# Patient Record
Sex: Male | Born: 1965
Health system: Southern US, Community
[De-identification: ages and names within clinical notes are randomized; demographics above are authoritative.]

## PROBLEM LIST (undated history)

## (undated) DIAGNOSIS — E119 Type 2 diabetes mellitus without complications: Secondary | ICD-10-CM

## (undated) DIAGNOSIS — E7800 Pure hypercholesterolemia, unspecified: Secondary | ICD-10-CM

## (undated) DIAGNOSIS — Z973 Presence of spectacles and contact lenses: Secondary | ICD-10-CM

## (undated) DIAGNOSIS — I10 Essential (primary) hypertension: Secondary | ICD-10-CM

## (undated) DIAGNOSIS — R3912 Poor urinary stream: Secondary | ICD-10-CM

## (undated) DIAGNOSIS — N35919 Unspecified urethral stricture, male, unspecified site: Secondary | ICD-10-CM

## (undated) HISTORY — PX: OTHER SURGICAL HISTORY: SHX169

---

## 2013-11-02 ENCOUNTER — Ambulatory Visit (HOSPITAL_COMMUNITY)
Admission: RE | Admit: 2013-11-02 | Discharge: 2013-11-02 | Disposition: A | Discharge status: Home / Self Care | Payer: BC Managed Care – PPO | Source: Ambulatory Visit | Attending: Internal Medicine | Admitting: Internal Medicine

## 2013-11-02 ENCOUNTER — Other Ambulatory Visit (HOSPITAL_COMMUNITY): Payer: Self-pay | Admitting: Internal Medicine

## 2013-11-02 DIAGNOSIS — R51 Headache: Principal | ICD-10-CM

## 2013-11-02 DIAGNOSIS — R609 Edema, unspecified: Secondary | ICD-10-CM

## 2013-11-02 DIAGNOSIS — R519 Headache, unspecified: Secondary | ICD-10-CM

## 2016-07-25 ENCOUNTER — Observation Stay (HOSPITAL_COMMUNITY)
Admission: EM | Admit: 2016-07-25 | Discharge: 2016-07-27 | Disposition: A | Discharge status: Home / Self Care | Payer: Managed Care, Other (non HMO) | Attending: Internal Medicine | Admitting: Internal Medicine

## 2016-07-25 ENCOUNTER — Encounter (HOSPITAL_COMMUNITY): Payer: Self-pay | Admitting: *Deleted

## 2016-07-25 DIAGNOSIS — Z9114 Patient's other noncompliance with medication regimen: Secondary | ICD-10-CM | POA: Diagnosis not present

## 2016-07-25 DIAGNOSIS — E7800 Pure hypercholesterolemia, unspecified: Secondary | ICD-10-CM | POA: Diagnosis present

## 2016-07-25 DIAGNOSIS — F17200 Nicotine dependence, unspecified, uncomplicated: Secondary | ICD-10-CM | POA: Insufficient documentation

## 2016-07-25 DIAGNOSIS — E1165 Type 2 diabetes mellitus with hyperglycemia: Secondary | ICD-10-CM | POA: Insufficient documentation

## 2016-07-25 DIAGNOSIS — IMO0002 Reserved for concepts with insufficient information to code with codable children: Secondary | ICD-10-CM | POA: Diagnosis present

## 2016-07-25 DIAGNOSIS — R739 Hyperglycemia, unspecified: Secondary | ICD-10-CM

## 2016-07-25 DIAGNOSIS — R509 Fever, unspecified: Secondary | ICD-10-CM | POA: Diagnosis present

## 2016-07-25 DIAGNOSIS — E782 Mixed hyperlipidemia: Secondary | ICD-10-CM | POA: Diagnosis not present

## 2016-07-25 DIAGNOSIS — N390 Urinary tract infection, site not specified: Principal | ICD-10-CM | POA: Insufficient documentation

## 2016-07-25 DIAGNOSIS — Z720 Tobacco use: Secondary | ICD-10-CM | POA: Diagnosis present

## 2016-07-25 DIAGNOSIS — N12 Tubulo-interstitial nephritis, not specified as acute or chronic: Secondary | ICD-10-CM | POA: Diagnosis not present

## 2016-07-25 DIAGNOSIS — Z794 Long term (current) use of insulin: Secondary | ICD-10-CM

## 2016-07-25 HISTORY — DX: Pure hypercholesterolemia, unspecified: E78.00

## 2016-07-25 HISTORY — DX: Type 2 diabetes mellitus without complications: E11.9

## 2016-07-25 LAB — CBC WITH DIFFERENTIAL/PLATELET
BASOS ABS: 0 10*3/uL (ref 0.0–0.1)
BASOS PCT: 0 %
EOS ABS: 0 10*3/uL (ref 0.0–0.7)
EOS PCT: 0 %
HCT: 39.1 % (ref 39.0–52.0)
Hemoglobin: 13.4 g/dL (ref 13.0–17.0)
LYMPHS PCT: 11 %
Lymphs Abs: 1.9 10*3/uL (ref 0.7–4.0)
MCH: 29.1 pg (ref 26.0–34.0)
MCHC: 34.3 g/dL (ref 30.0–36.0)
MCV: 84.8 fL (ref 78.0–100.0)
MONO ABS: 1.4 10*3/uL — AB (ref 0.1–1.0)
Monocytes Relative: 8 %
Neutro Abs: 13.3 10*3/uL — ABNORMAL HIGH (ref 1.7–7.7)
Neutrophils Relative %: 81 %
PLATELETS: 285 10*3/uL (ref 150–400)
RBC: 4.61 MIL/uL (ref 4.22–5.81)
RDW: 13.2 % (ref 11.5–15.5)
WBC: 16.6 10*3/uL — AB (ref 4.0–10.5)

## 2016-07-25 LAB — URINALYSIS, ROUTINE W REFLEX MICROSCOPIC
BILIRUBIN URINE: NEGATIVE
Ketones, ur: 5 mg/dL — AB
NITRITE: NEGATIVE
PROTEIN: 30 mg/dL — AB
Specific Gravity, Urine: 1.024 (ref 1.005–1.030)
pH: 5 (ref 5.0–8.0)

## 2016-07-25 LAB — GLUCOSE, CAPILLARY
GLUCOSE-CAPILLARY: 394 mg/dL — AB (ref 65–99)
GLUCOSE-CAPILLARY: 359 mg/dL — AB (ref 65–99)
GLUCOSE-CAPILLARY: 107 mg/dL — AB (ref 65–99)
Glucose-Capillary: 129 mg/dL — ABNORMAL HIGH (ref 65–99)

## 2016-07-25 LAB — COMPREHENSIVE METABOLIC PANEL
ALBUMIN: 3.1 g/dL — AB (ref 3.5–5.0)
ALK PHOS: 100 U/L (ref 38–126)
ALT: 44 U/L (ref 17–63)
AST: 14 U/L — AB (ref 15–41)
Anion gap: 9 (ref 5–15)
BILIRUBIN TOTAL: 0.4 mg/dL (ref 0.3–1.2)
BUN: 13 mg/dL (ref 6–20)
CALCIUM: 9 mg/dL (ref 8.9–10.3)
CO2: 25 mmol/L (ref 22–32)
CREATININE: 0.94 mg/dL (ref 0.61–1.24)
Chloride: 97 mmol/L — ABNORMAL LOW (ref 101–111)
GFR calc Af Amer: >60 mL/min (ref >60)
GLUCOSE: 335 mg/dL — AB (ref 65–99)
POTASSIUM: 4.1 mmol/L (ref 3.5–5.1)
Sodium: 131 mmol/L — ABNORMAL LOW (ref 135–145)
TOTAL PROTEIN: 7.1 g/dL (ref 6.5–8.1)

## 2016-07-25 LAB — LACTIC ACID, PLASMA: Lactic Acid, Venous: 1 mmol/L (ref 0.5–1.9)

## 2016-07-25 LAB — CBG MONITORING, ED
GLUCOSE-CAPILLARY: 394 mg/dL — AB (ref 65–99)
GLUCOSE-CAPILLARY: 338 mg/dL — AB (ref 65–99)

## 2016-07-25 MED ORDER — SODIUM CHLORIDE 0.9 % IV BOLUS (SEPSIS)
500.0000 mL | Freq: Once | INTRAVENOUS | Status: AC
  Administered 2016-07-25: 500 mL via INTRAVENOUS

## 2016-07-25 MED ORDER — INSULIN ASPART 100 UNIT/ML ~~LOC~~ SOLN
0.0000 [IU] | Freq: Three times a day (TID) | SUBCUTANEOUS | Status: DC
  Administered 2016-07-25 (×2): 15 [IU] via SUBCUTANEOUS
  Administered 2016-07-25: 2 [IU] via SUBCUTANEOUS
  Administered 2016-07-26 (×2): 3 [IU] via SUBCUTANEOUS
  Administered 2016-07-26: 8 [IU] via SUBCUTANEOUS
  Administered 2016-07-27: 3 [IU] via SUBCUTANEOUS

## 2016-07-25 MED ORDER — DEXTROSE 5 % IV SOLN
1.0000 g | INTRAVENOUS | Status: DC
  Administered 2016-07-25 – 2016-07-27 (×3): 1 g via INTRAVENOUS
  Filled 2016-07-25 (×5): qty 10

## 2016-07-25 MED ORDER — SODIUM CHLORIDE 0.9 % IV BOLUS (SEPSIS)
1000.0000 mL | Freq: Once | INTRAVENOUS | Status: AC
  Administered 2016-07-25: 1000 mL via INTRAVENOUS

## 2016-07-25 MED ORDER — ENOXAPARIN SODIUM 40 MG/0.4ML ~~LOC~~ SOLN
40.0000 mg | SUBCUTANEOUS | Status: DC
  Administered 2016-07-25 – 2016-07-26 (×2): 40 mg via SUBCUTANEOUS
  Filled 2016-07-25 (×3): qty 0.4

## 2016-07-25 MED ORDER — DEXTROSE 5 % IV SOLN
1.0000 g | Freq: Once | INTRAVENOUS | Status: AC
  Administered 2016-07-25: 1 g via INTRAVENOUS
  Filled 2016-07-25: qty 10

## 2016-07-25 MED ORDER — SODIUM CHLORIDE 0.9 % IV SOLN
INTRAVENOUS | Status: DC
  Administered 2016-07-25 – 2016-07-27 (×4): via INTRAVENOUS

## 2016-07-25 MED ORDER — INSULIN ASPART 100 UNIT/ML ~~LOC~~ SOLN: 0.0000 [IU] | Freq: Every day | SUBCUTANEOUS | Status: DC

## 2016-07-25 NOTE — Progress Notes (Signed)
Inpatient Diabetes Program Recommendations  AACE/ADA: New Consensus Statement on Inpatient Glycemic Control (2015)  Target Ranges:  Prepandial:   less than 140 mg/dL      Peak postprandial:   less than 180 mg/dL (1-2 hours)      Critically ill patients:  140 - 180 mg/dL  Results for Meridee ScoreHALL, Manpreet (MRN [id number]) as of 07/25/2016 09:03  Ref. Range 07/25/2016 03:18 07/25/2016 05:14 07/25/2016 07:59  Glucose-Capillary Latest Ref Range: 65 - 99 mg/dL 409394 (H) 811338 (H) 914394 (H)    Review of Glycemic Control  Diabetes history: DM2 Outpatient Diabetes medications: None listed on home med list but per H&P patient was on Metformin and insulin but has not taken in over a year due to cost Current orders for Inpatient glycemic control: Novolog 0-15 units TID with meals, Novolog 0-5 units QHS  Inpatient Diabetes Program Recommendations: Insulin - Basal: Please consider ordering 70/30 10 units BID with meals. HgbA1C: A1C in process.  NOTE: Patient has orders for Novolog correction which have not been released yet by nursing. Called Val,LPN and asked that orders be released and correction given.  Thanks, Orlando PennerMarie Thurl Boen, RN, MSN, CDE Diabetes Coordinator Inpatient Diabetes Program [phone] (Team Pager from 8am to 5pm)

## 2016-07-25 NOTE — Clinical Social Work Note (Signed)
CSW received consult and discussed with RN who reports referral is for medication assistance. CM notified. Will sign off.  Derenda FennelKara Karleigh Bunte, LCSW [phone]

## 2016-07-25 NOTE — Care Management Note (Signed)
Case Management Note  Patient Details  Name: Bryce Stewart MRN: [id number] Date of Birth: 12/01/1965  Subjective/Objective:                  Pt is from home, ind with ADL's. He is employed and has insurance with drug coverage. Pt's insurance changed earlier this year and his insulin became to expensive for his to take so he stopped taking it. CM will discuss pt's needs to DC and do benefits check to determine most cost efficient regime for pt.   Action/Plan: Pt will need Rx for glucose meter and supplies also. Will cont to follow.   Expected Discharge Date:  07/26/16               Expected Discharge Plan:  Home/Self Care  In-House Referral:  NA  Discharge planning Services  CM Consult, Medication Assistance  Post Acute Care Choice:  NA Choice offered to:  NA  Status of Service:  In process, will continue to follow  [name], [name], RN 07/25/2016, 3:17 PM

## 2016-07-25 NOTE — ED Provider Notes (Signed)
[facility]-EM[facility]ENCY DEPT Provider Note   CSN: [id number] Arrival date & time: 07/25/16  0259  Time seen 03:23 AM   History   Chief Complaint Chief Complaint  Patient presents with  . Generalized Body Aches    HPI Bryce Stewart is a 50 y.o. male.  HPI  patient reports that around Thanksgiving, November 23 he had some chills and he took Mucinex DM and felt better. However he started having chills again on December 4 that he states lasts for hours at a time. His wife states when she took his temperature on Monday it was 102. He denies cough, rhinorrhea, sore throat, nausea, or vomiting. He states he had diarrhea once. He reports urinary frequency for months and states for the past month he's had dysuria. He states he has a lot of thirst and he drinks a lot of diet sodas especially sun drop. He denies any recent weight loss in the past year. He did not get a flu shot this year.  Patient used to be on metformin, Crestor, and insulin twice a day however he stopped all of it a year ago. He was having episodes of hypoglycemia and feeling bad. He states his primary care doctor is aware and tries to convince him to take his medications.  PCP Dr Felecia Shelling  Past Medical History:  Diagnosis Date  . Diabetes mellitus without complication (HCC)   . High cholesterol     There are no active problems to display for this patient.   History reviewed. No pertinent surgical history.     Home Medications    Prior to Admission medications   Not on File    Family History History reviewed. No pertinent family history.  Social History Social History  Substance Use Topics  . Smoking status: Current Every Day Smoker    Packs/day: 0.50  . Smokeless tobacco: Never Used  . Alcohol use No  lives with spouse employed   Allergies   Patient has no known allergies.   Review of Systems Review of Systems  All other systems reviewed and are negative.    Physical Exam Updated Vital Signs BP 145/84  (BP Location: Left Arm)   Pulse 96   Temp 99.9 F (37.7 C) (Oral)   Ht 5' 7.5" (1.715 m)   Wt 182 lb (82.6 kg)   SpO2 98%   BMI 28.08 kg/m   Vital signs normal except for low-grade temp   Physical Exam   ED Treatments / Results  Labs (all labs ordered are listed, but only abnormal results are displayed) Results for orders placed or performed during the hospital encounter of 07/25/16  Culture, blood (routine x 2)  Result Value Ref Range   Specimen Description BLOOD RIGHT FOREARM    Special Requests BOTTLES DRAWN AEROBIC AND ANAEROBIC 10CC EACH    Culture PENDING    Report Status PENDING   Culture, blood (routine x 2)  Result Value Ref Range   Specimen Description BLOOD LEFT ANTECUBITAL    Special Requests BOTTLES DRAWN AEROBIC AND ANAEROBIC 12CC EACH    Culture PENDING    Report Status PENDING   Comprehensive metabolic panel  Result Value Ref Range   Sodium 131 (L) 135 - 145 mmol/L   Potassium 4.1 3.5 - 5.1 mmol/L   Chloride 97 (L) 101 - 111 mmol/L   CO2 25 22 - 32 mmol/L   Glucose, Bld 335 (H) 65 - 99 mg/dL   BUN 13 6 - 20 mg/dL   Creatinine, Ser 4.09  0.61 - 1.24 mg/dL   Calcium 9.0 8.9 - 16.110.3 mg/dL   Total Protein 7.1 6.5 - 8.1 g/dL   Albumin 3.1 (L) 3.5 - 5.0 g/dL   AST 14 (L) 15 - 41 U/L   ALT 44 17 - 63 U/L   Alkaline Phosphatase 100 38 - 126 U/L   Total Bilirubin 0.4 0.3 - 1.2 mg/dL   GFR calc non Af Amer >60 >60 mL/min   GFR calc Af Amer >60 >60 mL/min   Anion gap 9 5 - 15  CBC with Differential  Result Value Ref Range   WBC 16.6 (H) 4.0 - 10.5 K/uL   RBC 4.61 4.22 - 5.81 MIL/uL   Hemoglobin 13.4 13.0 - 17.0 g/dL   HCT 09.639.1 04.539.0 - 40.952.0 %   MCV 84.8 78.0 - 100.0 fL   MCH 29.1 26.0 - 34.0 pg   MCHC 34.3 30.0 - 36.0 g/dL   RDW 81.113.2 91.411.5 - 78.215.5 %   Platelets 285 150 - 400 K/uL   Neutrophils Relative % 81 %   Neutro Abs 13.3 (H) 1.7 - 7.7 K/uL   Lymphocytes Relative 11 %   Lymphs Abs 1.9 0.7 - 4.0 K/uL   Monocytes Relative 8 %   Monocytes Absolute  1.4 (H) 0.1 - 1.0 K/uL   Eosinophils Relative 0 %   Eosinophils Absolute 0.0 0.0 - 0.7 K/uL   Basophils Relative 0 %   Basophils Absolute 0.0 0.0 - 0.1 K/uL  Urinalysis, Routine w reflex microscopic  Result Value Ref Range   Color, Urine YELLOW YELLOW   APPearance HAZY (A) CLEAR   Specific Gravity, Urine 1.024 1.005 - 1.030   pH 5.0 5.0 - 8.0   Glucose, UA >=500 (A) NEGATIVE mg/dL   Hgb urine dipstick SMALL (A) NEGATIVE   Bilirubin Urine NEGATIVE NEGATIVE   Ketones, ur 5 (A) NEGATIVE mg/dL   Protein, ur 30 (A) NEGATIVE mg/dL   Nitrite NEGATIVE NEGATIVE   Leukocytes, UA MODERATE (A) NEGATIVE   RBC / HPF 0-5 0 - 5 RBC/hpf   WBC, UA TOO NUMEROUS TO COUNT 0 - 5 WBC/hpf   Bacteria, UA RARE (A) NONE SEEN  Lactic acid, plasma  Result Value Ref Range   Lactic Acid, Venous 1.0 0.5 - 1.9 mmol/L  POC CBG, ED  Result Value Ref Range   Glucose-Capillary 394 (H) 65 - 99 mg/dL  CBG monitoring, ED  Result Value Ref Range   Glucose-Capillary 338 (H) 65 - 99 mg/dL   Laboratory interpretation all normal except hyperglycemia, leukocytosis, UTI    EKG  EKG Interpretation None       Radiology No results found.  Procedures Procedures (including critical care time)  Medications Ordered in ED Medications  sodium chloride 0.9 % bolus 1,000 mL (0 mLs Intravenous Stopped 07/25/16 0518)  sodium chloride 0.9 % bolus 500 mL (0 mLs Intravenous Stopped 07/25/16 0600)  cefTRIAXone (ROCEPHIN) 1 g in dextrose 5 % 50 mL IVPB (0 g Intravenous Stopped 07/25/16 0600)     Initial Impression / Assessment and Plan / ED Course  I have reviewed the triage vital signs and the nursing notes.  Pertinent labs & imaging results that were available during my care of the patient were reviewed by me and considered in my medical decision making (see chart for details).  Clinical Course    Patient had IV inserted. Blood work was ordered, patient was requested to get a urinalysis sample. I'm hoping he has a  urinary tract infection  as the source of his fever. We had a long discussion about not treating his diabetes and the potential long term problems it could turn into.  After reviewing patient's urinalysis he was started on IV Rocephin.  5 AM, I discussed patient's test results with patient and his wife. Wife states his temperature at home tonight was 101. Patient states he still doesn't feel well after the IV fluids. I am going to talk to the hospitalist about admission for IV antibiotics and control of his diabetes. Repeat temperature is still low grade. His CBG is still in the low 300's after the liter of NS.   05:19 AM Dr Conley RollsLe, hospitalist, will admit  Final Clinical Impressions(s) / ED Diagnoses   Final diagnoses:  Fever, unspecified fever cause  Urinary tract infection without hematuria, site unspecified  Hyperglycemia  Noncompliance with medications    Plan admission  Devoria AlbeIva Yarelis Ambrosino, MD, Concha PyoFACEP     Tapanga Ottaway, MD 07/25/16 [phone]

## 2016-07-25 NOTE — H&P (Signed)
History and Physical    Bryce ScoreMark Levene ZOX:[id number] DOB: 07/05/1966 DOA: 07/25/2016  PCP: Avon GullyFANTA,TESFAYE, MD  Patient coming from: Home.    Chief Complaint:  Chills.   HPI: Bryce Stewart is an 50 y.o. male with hx of DM2 on metformin and insulin, hx of HLD, BPH, hadn't taken any of his meds for a year due to cost, tobacco abuse, presented to the ER with low back pain and chills.  Evaluation in the ER included a UA with TNTC WBCs, and leukocytosis with WBC of 16K, BS of 300s and no acidosis.  He denied SOB, nausea, vomiting, abdominal pain, or hematuria.  Hospitalist was asked to admit him for pyelonephritis and DM out of control.    ED Course:  See above.  Rewiew of Systems:  Constitutional: Negative for malaise, fever and chills. No significant weight loss or weight gain Eyes: Negative for eye pain, redness and discharge, diplopia, visual changes, or flashes of light. ENMT: Negative for ear pain, hoarseness, nasal congestion, sinus pressure and sore throat. No headaches; tinnitus, drooling, or problem swallowing. Cardiovascular: Negative for chest pain, palpitations, diaphoresis, dyspnea and peripheral edema. ; No orthopnea, PND Respiratory: Negative for cough, hemoptysis, wheezing and stridor. No pleuritic chestpain. Gastrointestinal: Negative for diarrhea, constipation,  melena, blood in stool, hematemesis, jaundice and rectal bleeding.    Genitourinary: Negative for frequency, dysuria, incontinence,flank pain and hematuria; Musculoskeletal: Negative for back pain and neck pain. Negative for swelling and trauma.;  Skin: . Negative for pruritus, rash, abrasions, bruising and skin lesion.; ulcerations Neuro: Negative for headache, lightheadedness and neck stiffness. Negative for weakness, altered level of consciousness , altered mental status, extremity weakness, burning feet, involuntary movement, seizure and syncope.  Psych: negative for anxiety, depression, insomnia, tearfulness, panic attacks,  hallucinations, paranoia, suicidal or homicidal ideation    Past Medical History:  Diagnosis Date  . Diabetes mellitus without complication (HCC)   . High cholesterol     History reviewed. No pertinent surgical history.   reports that he has been smoking.  He has been smoking about 0.50 packs per day. He has never used smokeless tobacco. He reports that he does not drink alcohol or use drugs.  Allergies no known allergies  History reviewed. No pertinent family history.   Prior to Admission medications   Not on File    Physical Exam: Vitals:   07/25/16 0309 07/25/16 0313 07/25/16 0515  BP:  145/84 130/84  Pulse:  96   Resp:   18  Temp:  99.9 F (37.7 C) 99.1 F (37.3 C)  TempSrc:  Oral Oral  SpO2:  98% 96%  Weight: 82.6 kg (182 lb)    Height: 5' 7.5" (1.715 m)     Constitutional: NAD, calm, comfortable Vitals:   07/25/16 0309 07/25/16 0313 07/25/16 0515  BP:  145/84 130/84  Pulse:  96   Resp:   18  Temp:  99.9 F (37.7 C) 99.1 F (37.3 C)  TempSrc:  Oral Oral  SpO2:  98% 96%  Weight: 82.6 kg (182 lb)    Height: 5' 7.5" (1.715 m)     Eyes: PERRL, lids and conjunctivae normal ENMT: Mucous membranes are moist. Posterior pharynx clear of any exudate or lesions.Normal dentition.  Neck: normal, supple, no masses, no thyromegaly Respiratory: clear to auscultation bilaterally, no wheezing, no crackles. Normal respiratory effort. No accessory muscle use.  Cardiovascular: Regular rate and rhythm, no murmurs / rubs / gallops. No extremity edema. 2+ pedal pulses. No carotid bruits.  Abdomen: no  tenderness, no masses palpated. No hepatosplenomegaly. Bowel sounds positive.  Musculoskeletal: no clubbing / cyanosis. No joint deformity upper and lower extremities. Good ROM, no contractures. Normal muscle tone.  Skin: no rashes, lesions, ulcers. No induration Neurologic: CN 2-12 grossly intact. Sensation intact, DTR normal. Strength 5/5 in all 4.  Psychiatric: Normal judgment  and insight. Alert and oriented x 3. Normal mood.   Labs on Admission: I have personally reviewed following labs and imaging studies  CBC:  Recent Labs Lab 07/25/16 0349  WBC 16.6*  NEUTROABS 13.3*  HGB 13.4  HCT 39.1  MCV 84.8  PLT 285   Basic Metabolic Panel:  Recent Labs Lab 07/25/16 0349  NA 131*  K 4.1  CL 97*  CO2 25  GLUCOSE 335*  BUN 13  CREATININE 0.94  CALCIUM 9.0   GFR: Estimated Creatinine Clearance: 99.8 mL/min (by C-G formula based on SCr of 0.94 mg/dL). Liver Function Tests:  Recent Labs Lab 07/25/16 0349  AST 14*  ALT 44  ALKPHOS 100  BILITOT 0.4  PROT 7.1  ALBUMIN 3.1*   CBG:  Recent Labs Lab 07/25/16 0318 07/25/16 0514  GLUCAP 394* 338*   Lipid Profile: Urine analysis:    Component Value Date/Time   COLORURINE YELLOW 07/25/2016 0349   APPEARANCEUR HAZY (A) 07/25/2016 0349   LABSPEC 1.024 07/25/2016 0349   PHURINE 5.0 07/25/2016 0349   GLUCOSEU >=500 (A) 07/25/2016 0349   HGBUR SMALL (A) 07/25/2016 0349   BILIRUBINUR NEGATIVE 07/25/2016 0349   KETONESUR 5 (A) 07/25/2016 0349   PROTEINUR 30 (A) 07/25/2016 0349   NITRITE NEGATIVE 07/25/2016 0349   LEUKOCYTESUR MODERATE (A) 07/25/2016 0349    Recent Results (from the past 240 hour(s))  Culture, blood (routine x 2)     Status: None (Preliminary result)   Collection Time: 07/25/16  3:49 AM  Result Value Ref Range Status   Specimen Description BLOOD RIGHT FOREARM  Final   Special Requests BOTTLES DRAWN AEROBIC AND ANAEROBIC 10CC EACH  Final   Culture PENDING  Incomplete   Report Status PENDING  Incomplete  Culture, blood (routine x 2)     Status: None (Preliminary result)   Collection Time: 07/25/16  4:17 AM  Result Value Ref Range Status   Specimen Description BLOOD LEFT ANTECUBITAL  Final   Special Requests BOTTLES DRAWN AEROBIC AND ANAEROBIC 12CC EACH  Final   Culture PENDING  Incomplete   Report Status PENDING  Incomplete     EKG: Independently reviewed.    Assessment/Plan Active Problems:   Diabetes mellitus out of control (HCC)   Pyelonephritis   Tobacco abuse   HLD (hyperlipidemia)    PLAN:   Pyelonephritis:  Will give IVF, continue with IV Rocephin.  BC and urine cultures obtained.  Will admit to Dr Letitia NeriFanta's service.  He is hemodynamically stable.  DM out of control:  He will need to be more compliant going forward and was told of this.  Consider ACE I.  For inpatient, will use SSI.  He can have his oral meds resumed upon discharge.  Tobacco abuse:  Advised stop.  HLD:  Will check lipid panel.  Goal LDL of 70.     DVT prophylaxis: Levenox.  Code Status: FULL CODE.  Family Communication: wife at bedside.  Disposition Plan: to home when appropriate.  Consults called: None.  Admission status: OBS.    Malavika Lira MD FACP. [facility]  If 7PM-7AM, please contact night-coverage [email or web address] Password [email or web address]  07/25/2016, 6:11 AM

## 2016-07-25 NOTE — ED Triage Notes (Signed)
Pt c/o generalized body aches with fever and chills for over one week

## 2016-07-26 LAB — LIPID PANEL
CHOL/HDL RATIO: 6.5 ratio
CHOLESTEROL: 169 mg/dL (ref 0–200)
HDL: 26 mg/dL — ABNORMAL LOW (ref >40)
LDL Cholesterol: 118 mg/dL — ABNORMAL HIGH (ref 0–99)
TRIGLYCERIDES: 124 mg/dL (ref <150)
VLDL: 25 mg/dL (ref 0–40)

## 2016-07-26 LAB — GLUCOSE, CAPILLARY
GLUCOSE-CAPILLARY: 194 mg/dL — AB (ref 65–99)
Glucose-Capillary: 199 mg/dL — ABNORMAL HIGH (ref 65–99)
Glucose-Capillary: 258 mg/dL — ABNORMAL HIGH (ref 65–99)
Glucose-Capillary: 163 mg/dL — ABNORMAL HIGH (ref 65–99)

## 2016-07-26 LAB — CBC WITH DIFFERENTIAL/PLATELET
BASOS PCT: 0 %
Basophils Absolute: 0 10*3/uL (ref 0.0–0.1)
EOS ABS: 0.1 10*3/uL (ref 0.0–0.7)
Eosinophils Relative: 1 %
HEMATOCRIT: 40.3 % (ref 39.0–52.0)
HEMOGLOBIN: 13.3 g/dL (ref 13.0–17.0)
LYMPHS ABS: 3.2 10*3/uL (ref 0.7–4.0)
Lymphocytes Relative: 28 %
MCH: 28.4 pg (ref 26.0–34.0)
MCHC: 33 g/dL (ref 30.0–36.0)
MCV: 85.9 fL (ref 78.0–100.0)
MONO ABS: 0.8 10*3/uL (ref 0.1–1.0)
MONOS PCT: 8 %
NEUTROS PCT: 63 %
Neutro Abs: 7.1 10*3/uL (ref 1.7–7.7)
Platelets: 305 10*3/uL (ref 150–400)
RBC: 4.69 MIL/uL (ref 4.22–5.81)
RDW: 13.5 % (ref 11.5–15.5)
WBC: 11.3 10*3/uL — ABNORMAL HIGH (ref 4.0–10.5)

## 2016-07-26 LAB — BASIC METABOLIC PANEL
Anion gap: 7 (ref 5–15)
BUN: 12 mg/dL (ref 6–20)
CALCIUM: 8.8 mg/dL — AB (ref 8.9–10.3)
CHLORIDE: 104 mmol/L (ref 101–111)
CO2: 24 mmol/L (ref 22–32)
CREATININE: 0.92 mg/dL (ref 0.61–1.24)
GFR calc Af Amer: >60 mL/min (ref >60)
GFR calc non Af Amer: >60 mL/min (ref >60)
Glucose, Bld: 168 mg/dL — ABNORMAL HIGH (ref 65–99)
Potassium: 3.9 mmol/L (ref 3.5–5.1)
SODIUM: 135 mmol/L (ref 135–145)

## 2016-07-26 LAB — HEMOGLOBIN A1C
Hgb A1c MFr Bld: 13.4 % — ABNORMAL HIGH (ref 4.8–5.6)
Mean Plasma Glucose: 338 mg/dL

## 2016-07-26 MED ORDER — INSULIN GLARGINE 100 UNIT/ML ~~LOC~~ SOLN
30.0000 [IU] | Freq: Every day | SUBCUTANEOUS | Status: DC
  Administered 2016-07-26: 30 [IU] via SUBCUTANEOUS
  Filled 2016-07-26 (×3): qty 0.3

## 2016-07-26 MED ORDER — LIVING WELL WITH DIABETES BOOK
Freq: Once | Status: AC
  Administered 2016-07-26: 10:00:00
  Filled 2016-07-26: qty 1

## 2016-07-26 MED ORDER — ATORVASTATIN CALCIUM 20 MG PO TABS
20.0000 mg | ORAL_TABLET | Freq: Every day | ORAL | Status: DC
  Administered 2016-07-26: 20 mg via ORAL
  Filled 2016-07-26: qty 1

## 2016-07-26 MED ORDER — METFORMIN HCL 500 MG PO TABS
500.0000 mg | ORAL_TABLET | Freq: Two times a day (BID) | ORAL | Status: DC
  Administered 2016-07-26 – 2016-07-27 (×3): 500 mg via ORAL
  Filled 2016-07-26 (×3): qty 1

## 2016-07-26 NOTE — Progress Notes (Signed)
Subjective: This is a 50 years old male with history of diabetes mellitus type II was admitted due to fever and chills secondary to pyelonephritis. Patient is started on iv antibiotics. He is feeling better. Patient has not taking any of his nmedications for several months. His HGA1C is above 13.  Objective: Vital signs in last 24 hours: Temp:  [98.5 F (36.9 C)-98.7 F (37.1 C)] 98.7 F (37.1 C) (12/08 0610) Pulse Rate:  [68-84] 84 (12/08 0610) Resp:  [17-20] 17 (12/08 0610) BP: (117-133)/(64-71) 133/71 (12/08 0610) SpO2:  [98 %-100 %] 100 % (12/08 0610) Weight change:  Last BM Date: 07/25/16  Intake/Output from previous day: 12/07 0701 - 12/08 0700 In: 2090 [P.O.:480; I.V.:1560; IV Piggyback:50] Out: 9476 [Urine:1650]  PHYSICAL EXAM General appearance: alert and no distress Resp: clear to auscultation bilaterally Cardio: S1, S2 normal GI: soft, non-tender; bowel sounds normal; no masses,  no organomegaly Extremities: extremities normal, atraumatic, no cyanosis or edema  Lab Results:  Results for orders placed or performed during the hospital encounter of 07/25/16 (from the past 48 hour(s))  POC CBG, ED     Status: Abnormal   Collection Time: 07/25/16  3:18 AM  Result Value Ref Range   Glucose-Capillary 394 (H) 65 - 99 mg/dL  Comprehensive metabolic panel     Status: Abnormal   Collection Time: 07/25/16  3:49 AM  Result Value Ref Range   Sodium 131 (L) 135 - 145 mmol/L   Potassium 4.1 3.5 - 5.1 mmol/L   Chloride 97 (L) 101 - 111 mmol/L   CO2 25 22 - 32 mmol/L   Glucose, Bld 335 (H) 65 - 99 mg/dL   BUN 13 6 - 20 mg/dL   Creatinine, Ser 0.94 0.61 - 1.24 mg/dL   Calcium 9.0 8.9 - 10.3 mg/dL   Total Protein 7.1 6.5 - 8.1 g/dL   Albumin 3.1 (L) 3.5 - 5.0 g/dL   AST 14 (L) 15 - 41 U/L   ALT 44 17 - 63 U/L   Alkaline Phosphatase 100 38 - 126 U/L   Total Bilirubin 0.4 0.3 - 1.2 mg/dL   GFR calc non Af Amer >60 >60 mL/min   GFR calc Af Amer >60 >60 mL/min    Comment:  (NOTE) The eGFR has been calculated using the CKD EPI equation. This calculation has not been validated in all clinical situations. eGFR's persistently <60 mL/min signify possible Chronic Kidney Disease.    Anion gap 9 5 - 15  CBC with Differential     Status: Abnormal   Collection Time: 07/25/16  3:49 AM  Result Value Ref Range   WBC 16.6 (H) 4.0 - 10.5 K/uL   RBC 4.61 4.22 - 5.81 MIL/uL   Hemoglobin 13.4 13.0 - 17.0 g/dL   HCT 39.1 39.0 - 52.0 %   MCV 84.8 78.0 - 100.0 fL   MCH 29.1 26.0 - 34.0 pg   MCHC 34.3 30.0 - 36.0 g/dL   RDW 13.2 11.5 - 15.5 %   Platelets 285 150 - 400 K/uL   Neutrophils Relative % 81 %   Neutro Abs 13.3 (H) 1.7 - 7.7 K/uL   Lymphocytes Relative 11 %   Lymphs Abs 1.9 0.7 - 4.0 K/uL   Monocytes Relative 8 %   Monocytes Absolute 1.4 (H) 0.1 - 1.0 K/uL   Eosinophils Relative 0 %   Eosinophils Absolute 0.0 0.0 - 0.7 K/uL   Basophils Relative 0 %   Basophils Absolute 0.0 0.0 - 0.1 K/uL  Urinalysis, Routine w reflex microscopic     Status: Abnormal   Collection Time: 07/25/16  3:49 AM  Result Value Ref Range   Color, Urine YELLOW YELLOW   APPearance HAZY (A) CLEAR   Specific Gravity, Urine 1.024 1.005 - 1.030   pH 5.0 5.0 - 8.0   Glucose, UA >=500 (A) NEGATIVE mg/dL   Hgb urine dipstick SMALL (A) NEGATIVE   Bilirubin Urine NEGATIVE NEGATIVE   Ketones, ur 5 (A) NEGATIVE mg/dL   Protein, ur 30 (A) NEGATIVE mg/dL   Nitrite NEGATIVE NEGATIVE   Leukocytes, UA MODERATE (A) NEGATIVE   RBC / HPF 0-5 0 - 5 RBC/hpf   WBC, UA TOO NUMEROUS TO COUNT 0 - 5 WBC/hpf   Bacteria, UA RARE (A) NONE SEEN  Culture, blood (routine x 2)     Status: None (Preliminary result)   Collection Time: 07/25/16  3:49 AM  Result Value Ref Range   Specimen Description BLOOD RIGHT FOREARM    Special Requests BOTTLES DRAWN AEROBIC AND ANAEROBIC 10CC EACH    Culture PENDING    Report Status PENDING   Hemoglobin A1c     Status: Abnormal   Collection Time: 07/25/16  3:49 AM  Result  Value Ref Range   Hgb A1c MFr Bld 13.4 (H) 4.8 - 5.6 %    Comment: (NOTE)         Pre-diabetes: 5.7 - 6.4         Diabetes: >6.4         Glycemic control for adults with diabetes: <7.0    Mean Plasma Glucose 338 mg/dL    Comment: (NOTE) Performed At: [facility] [address] [place], [place] [id number] Lindon Romp MD BD:[id number]   Culture, blood (routine x 2)     Status: None (Preliminary result)   Collection Time: 07/25/16  4:17 AM  Result Value Ref Range   Specimen Description BLOOD LEFT ANTECUBITAL    Special Requests BOTTLES DRAWN AEROBIC AND ANAEROBIC 12CC EACH    Culture PENDING    Report Status PENDING   CBG monitoring, ED     Status: Abnormal   Collection Time: 07/25/16  5:14 AM  Result Value Ref Range   Glucose-Capillary 338 (H) 65 - 99 mg/dL  Lactic acid, plasma     Status: None   Collection Time: 07/25/16  5:23 AM  Result Value Ref Range   Lactic Acid, Venous 1.0 0.5 - 1.9 mmol/L  Glucose, capillary     Status: Abnormal   Collection Time: 07/25/16  7:59 AM  Result Value Ref Range   Glucose-Capillary 394 (H) 65 - 99 mg/dL   Comment 1 Notify RN    Comment 2 Document in Chart   Glucose, capillary     Status: Abnormal   Collection Time: 07/25/16 12:12 PM  Result Value Ref Range   Glucose-Capillary 129 (H) 65 - 99 mg/dL   Comment 1 Notify RN    Comment 2 Document in Chart   Glucose, capillary     Status: Abnormal   Collection Time: 07/25/16  6:22 PM  Result Value Ref Range   Glucose-Capillary 359 (H) 65 - 99 mg/dL   Comment 1 Notify RN    Comment 2 Document in Chart   Glucose, capillary     Status: Abnormal   Collection Time: 07/25/16  9:05 PM  Result Value Ref Range   Glucose-Capillary 107 (H) 65 - 99 mg/dL   Comment 1 Notify RN    Comment 2 Document  in Chart   Lipid panel     Status: Abnormal   Collection Time: 07/26/16  5:45 AM  Result Value Ref Range   Cholesterol 169 0 - 200 mg/dL   Triglycerides 124 <150 mg/dL   HDL 26 (L) >40  mg/dL   Total CHOL/HDL Ratio 6.5 RATIO   VLDL 25 0 - 40 mg/dL   LDL Cholesterol 118 (H) 0 - 99 mg/dL    Comment:        Total Cholesterol/HDL:CHD Risk Coronary Heart Disease Risk Table                     Men   Women  1/2 Average Risk   3.4   3.3  Average Risk       5.0   4.4  2 X Average Risk   9.6   7.1  3 X Average Risk  23.4   11.0        Use the calculated Patient Ratio above and the CHD Risk Table to determine the patient's CHD Risk.        ATP III CLASSIFICATION (LDL):  <100     mg/dL   Optimal  100-129  mg/dL   Near or Above                    Optimal  130-159  mg/dL   Borderline  160-189  mg/dL   High  >190     mg/dL   Very High   CBC with Differential/Platelet     Status: Abnormal   Collection Time: 07/26/16  5:45 AM  Result Value Ref Range   WBC 11.3 (H) 4.0 - 10.5 K/uL   RBC 4.69 4.22 - 5.81 MIL/uL   Hemoglobin 13.3 13.0 - 17.0 g/dL   HCT 40.3 39.0 - 52.0 %   MCV 85.9 78.0 - 100.0 fL   MCH 28.4 26.0 - 34.0 pg   MCHC 33.0 30.0 - 36.0 g/dL   RDW 13.5 11.5 - 15.5 %   Platelets 305 150 - 400 K/uL   Neutrophils Relative % 63 %   Neutro Abs 7.1 1.7 - 7.7 K/uL   Lymphocytes Relative 28 %   Lymphs Abs 3.2 0.7 - 4.0 K/uL   Monocytes Relative 8 %   Monocytes Absolute 0.8 0.1 - 1.0 K/uL   Eosinophils Relative 1 %   Eosinophils Absolute 0.1 0.0 - 0.7 K/uL   Basophils Relative 0 %   Basophils Absolute 0.0 0.0 - 0.1 K/uL  Basic metabolic panel     Status: Abnormal   Collection Time: 07/26/16  5:45 AM  Result Value Ref Range   Sodium 135 135 - 145 mmol/L   Potassium 3.9 3.5 - 5.1 mmol/L   Chloride 104 101 - 111 mmol/L   CO2 24 22 - 32 mmol/L   Glucose, Bld 168 (H) 65 - 99 mg/dL   BUN 12 6 - 20 mg/dL   Creatinine, Ser 0.92 0.61 - 1.24 mg/dL   Calcium 8.8 (L) 8.9 - 10.3 mg/dL   GFR calc non Af Amer >60 >60 mL/min   GFR calc Af Amer >60 >60 mL/min    Comment: (NOTE) The eGFR has been calculated using the CKD EPI equation. This calculation has not been validated  in all clinical situations. eGFR's persistently <60 mL/min signify possible Chronic Kidney Disease.    Anion gap 7 5 - 15  Glucose, capillary     Status: Abnormal   Collection Time: 07/26/16  7:27 AM  Result Value Ref Range   Glucose-Capillary 199 (H) 65 - 99 mg/dL   Comment 1 Notify RN    Comment 2 Document in Chart     ABGS No results for input(s): PHART, PO2ART, TCO2, HCO3 in the last 72 hours.  Invalid input(s): PCO2 CULTURES Recent Results (from the past 240 hour(s))  Culture, blood (routine x 2)     Status: None (Preliminary result)   Collection Time: 07/25/16  3:49 AM  Result Value Ref Range Status   Specimen Description BLOOD RIGHT FOREARM  Final   Special Requests BOTTLES DRAWN AEROBIC AND ANAEROBIC 10CC EACH  Final   Culture PENDING  Incomplete   Report Status PENDING  Incomplete  Culture, blood (routine x 2)     Status: None (Preliminary result)   Collection Time: 07/25/16  4:17 AM  Result Value Ref Range Status   Specimen Description BLOOD LEFT ANTECUBITAL  Final   Special Requests BOTTLES DRAWN AEROBIC AND ANAEROBIC 12CC EACH  Final   Culture PENDING  Incomplete   Report Status PENDING  Incomplete   Studies/Results: No results found.  Medications: I have reviewed the patient's current medications.  Assesment:  Active Problems:   Diabetes mellitus out of control ([place])   Pyelonephritis   Tobacco abuse   HLD (hyperlipidemia)    Plan: Medications reviewed Will continue IV antibiotics pending culture result Will start on metformin and lantus insulin Strongly advised to comply on his medications    LOS: 0 days   Jazen Spraggins 07/26/2016, 8:21 AM
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
## 2016-07-26 NOTE — Care Management Note (Addendum)
Case Management Note  Patient Details  Name: Meridee ScoreMark Turrell MRN: [id number] Date of Birth: 08/03/1966   Expected Discharge Date:  07/26/16               Expected Discharge Plan:  Home/Self Care  In-House Referral:  NA  Discharge planning Services  CM Consult, Medication Assistance  Post Acute Care Choice:  NA Choice offered to:  NA Status of Service:  Completed, signed off   Additional Comments: Pt discharging home tomorrow. Benefits check completed. Pt's Lantus is not covered. Levemir pen or vial co-pay $75.00 Tresiba $75.00 pen pnly. Pt made aware. Pt given $10-co-pay discount card by DC. Pt plans to return home with self care. No further CM needs anticipated.   Malcolm Metrohildress, Bluma Buresh Demske, RN 07/26/2016, 2:43 PM

## 2016-07-27 LAB — GLUCOSE, CAPILLARY: Glucose-Capillary: 200 mg/dL — ABNORMAL HIGH (ref 65–99)

## 2016-07-27 LAB — URINE CULTURE

## 2016-07-27 LAB — HEMOGLOBIN A1C
HEMOGLOBIN A1C: 13 % — AB (ref 4.8–5.6)
MEAN PLASMA GLUCOSE: 326 mg/dL

## 2016-07-27 MED ORDER — INSULIN GLARGINE 100 UNIT/ML SOLOSTAR PEN: 30.0000 [IU] | PEN_INJECTOR | Freq: Every day | SUBCUTANEOUS | 11 refills | Status: DC

## 2016-07-27 MED ORDER — ATORVASTATIN CALCIUM 20 MG PO TABS: 20.0000 mg | ORAL_TABLET | Freq: Every day | ORAL | 12 refills | Status: DC

## 2016-07-27 MED ORDER — METFORMIN HCL 500 MG PO TABS: 500.0000 mg | ORAL_TABLET | Freq: Two times a day (BID) | ORAL | 12 refills | Status: DC

## 2016-07-27 MED ORDER — CEFUROXIME AXETIL 500 MG PO TABS: 500.0000 mg | ORAL_TABLET | Freq: Two times a day (BID) | ORAL | 0 refills | Status: AC

## 2016-07-27 MED ORDER — INSULIN ASPART 100 UNIT/ML FLEXPEN
PEN_INJECTOR | SUBCUTANEOUS | 11 refills | Status: DC
Start: 2016-07-27 — End: 2016-10-27
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
## 2016-07-27 NOTE — Progress Notes (Signed)
Pt IV removed, tolerated well.  Reviewed discharge instructions with pt and answered all questions at this time.  

## 2016-07-31 LAB — CULTURE, BLOOD (ROUTINE X 2)
CULTURE: NO GROWTH
Culture: NO GROWTH

## 2016-10-26 DIAGNOSIS — A419 Sepsis, unspecified organism: Secondary | ICD-10-CM | POA: Diagnosis not present

## 2016-10-26 DIAGNOSIS — E1165 Type 2 diabetes mellitus with hyperglycemia: Secondary | ICD-10-CM | POA: Diagnosis present

## 2016-10-26 DIAGNOSIS — N390 Urinary tract infection, site not specified: Secondary | ICD-10-CM | POA: Diagnosis present

## 2016-10-26 DIAGNOSIS — F17210 Nicotine dependence, cigarettes, uncomplicated: Secondary | ICD-10-CM | POA: Diagnosis present

## 2016-10-26 DIAGNOSIS — E785 Hyperlipidemia, unspecified: Secondary | ICD-10-CM | POA: Diagnosis present

## 2016-10-26 DIAGNOSIS — Z79899 Other long term (current) drug therapy: Secondary | ICD-10-CM

## 2016-10-26 DIAGNOSIS — Z794 Long term (current) use of insulin: Secondary | ICD-10-CM

## 2016-10-26 DIAGNOSIS — Z833 Family history of diabetes mellitus: Secondary | ICD-10-CM

## 2016-10-26 DIAGNOSIS — E7800 Pure hypercholesterolemia, unspecified: Secondary | ICD-10-CM | POA: Diagnosis present

## 2016-10-26 DIAGNOSIS — E11621 Type 2 diabetes mellitus with foot ulcer: Secondary | ICD-10-CM | POA: Diagnosis present

## 2016-10-27 ENCOUNTER — Inpatient Hospital Stay (HOSPITAL_COMMUNITY)
Admission: EM | Admit: 2016-10-27 | Discharge: 2016-10-30 | DRG: 872 | Disposition: A | Discharge status: Home / Self Care | Payer: No Typology Code available for payment source | Attending: Internal Medicine | Admitting: Internal Medicine

## 2016-10-27 ENCOUNTER — Emergency Department (HOSPITAL_COMMUNITY): Payer: No Typology Code available for payment source

## 2016-10-27 ENCOUNTER — Encounter (HOSPITAL_COMMUNITY): Payer: Self-pay | Admitting: *Deleted

## 2016-10-27 DIAGNOSIS — E1165 Type 2 diabetes mellitus with hyperglycemia: Secondary | ICD-10-CM

## 2016-10-27 DIAGNOSIS — Z794 Long term (current) use of insulin: Secondary | ICD-10-CM | POA: Diagnosis not present

## 2016-10-27 DIAGNOSIS — E782 Mixed hyperlipidemia: Secondary | ICD-10-CM

## 2016-10-27 DIAGNOSIS — Z720 Tobacco use: Secondary | ICD-10-CM | POA: Diagnosis not present

## 2016-10-27 DIAGNOSIS — E7800 Pure hypercholesterolemia, unspecified: Secondary | ICD-10-CM | POA: Diagnosis present

## 2016-10-27 DIAGNOSIS — A419 Sepsis, unspecified organism: Secondary | ICD-10-CM | POA: Diagnosis present

## 2016-10-27 DIAGNOSIS — IMO0002 Reserved for concepts with insufficient information to code with codable children: Secondary | ICD-10-CM | POA: Diagnosis present

## 2016-10-27 DIAGNOSIS — N390 Urinary tract infection, site not specified: Secondary | ICD-10-CM

## 2016-10-27 LAB — BLOOD GAS, VENOUS
Acid-Base Excess: 0.1 mmol/L (ref 0.0–2.0)
Bicarbonate: 24.5 mmol/L (ref 20.0–28.0)
Drawn by: 22223
FIO2: 21
O2 Saturation: 97.4 %
PCO2 VEN: 38.5 mmHg — AB (ref 44.0–60.0)
PO2 VEN: 105 mmHg — AB (ref 32.0–45.0)
pH, Ven: 7.411 (ref 7.250–7.430)

## 2016-10-27 LAB — GLUCOSE, CAPILLARY
GLUCOSE-CAPILLARY: 180 mg/dL — AB (ref 65–99)
Glucose-Capillary: 256 mg/dL — ABNORMAL HIGH (ref 65–99)
Glucose-Capillary: 195 mg/dL — ABNORMAL HIGH (ref 65–99)

## 2016-10-27 LAB — INFLUENZA PANEL BY PCR (TYPE A & B)
INFLAPCR: NEGATIVE
INFLBPCR: NEGATIVE

## 2016-10-27 LAB — CBC WITH DIFFERENTIAL/PLATELET
BASOS PCT: 0 %
Basophils Absolute: 0 10*3/uL (ref 0.0–0.1)
Eosinophils Absolute: 0 10*3/uL (ref 0.0–0.7)
Eosinophils Relative: 0 %
HEMATOCRIT: 44.3 % (ref 39.0–52.0)
HEMOGLOBIN: 14.8 g/dL (ref 13.0–17.0)
LYMPHS ABS: 1.6 10*3/uL (ref 0.7–4.0)
LYMPHS PCT: 9 %
MCH: 28.1 pg (ref 26.0–34.0)
MCHC: 33.4 g/dL (ref 30.0–36.0)
MCV: 84.1 fL (ref 78.0–100.0)
MONOS PCT: 2 %
Monocytes Absolute: 0.4 10*3/uL (ref 0.1–1.0)
NEUTROS ABS: 16.5 10*3/uL — AB (ref 1.7–7.7)
NEUTROS PCT: 89 %
Platelets: 233 10*3/uL (ref 150–400)
RBC: 5.27 MIL/uL (ref 4.22–5.81)
RDW: 14.3 % (ref 11.5–15.5)
WBC: 18.5 10*3/uL — ABNORMAL HIGH (ref 4.0–10.5)

## 2016-10-27 LAB — URINALYSIS, ROUTINE W REFLEX MICROSCOPIC
Bilirubin Urine: NEGATIVE
Glucose, UA: >=500 mg/dL — AB
Ketones, ur: NEGATIVE mg/dL
Nitrite: NEGATIVE
PH: 5 (ref 5.0–8.0)
Protein, ur: 30 mg/dL — AB
SPECIFIC GRAVITY, URINE: 1.018 (ref 1.005–1.030)

## 2016-10-27 LAB — COMPREHENSIVE METABOLIC PANEL
ALBUMIN: 3.6 g/dL (ref 3.5–5.0)
ALT: 23 U/L (ref 17–63)
ANION GAP: 10 (ref 5–15)
AST: 24 U/L (ref 15–41)
Alkaline Phosphatase: 100 U/L (ref 38–126)
BUN: 18 mg/dL (ref 6–20)
CALCIUM: 9.4 mg/dL (ref 8.9–10.3)
CHLORIDE: 97 mmol/L — AB (ref 101–111)
CO2: 27 mmol/L (ref 22–32)
Creatinine, Ser: 1.17 mg/dL (ref 0.61–1.24)
GFR calc Af Amer: >60 mL/min (ref >60)
GFR calc non Af Amer: >60 mL/min (ref >60)
Glucose, Bld: 272 mg/dL — ABNORMAL HIGH (ref 65–99)
POTASSIUM: 4.1 mmol/L (ref 3.5–5.1)
SODIUM: 134 mmol/L — AB (ref 135–145)
Total Bilirubin: 0.7 mg/dL (ref 0.3–1.2)
Total Protein: 7.6 g/dL (ref 6.5–8.1)

## 2016-10-27 LAB — CBG MONITORING, ED: GLUCOSE-CAPILLARY: 324 mg/dL — AB (ref 65–99)

## 2016-10-27 LAB — I-STAT CG4 LACTIC ACID, ED: LACTIC ACID, VENOUS: 1.48 mmol/L (ref 0.5–1.9)

## 2016-10-27 MED ORDER — INSULIN GLARGINE 100 UNIT/ML ~~LOC~~ SOLN
30.0000 [IU] | Freq: Every day | SUBCUTANEOUS | Status: DC
  Filled 2016-10-27: qty 0.3

## 2016-10-27 MED ORDER — INSULIN ASPART 100 UNIT/ML ~~LOC~~ SOLN: 0.0000 [IU] | Freq: Three times a day (TID) | SUBCUTANEOUS | Status: DC

## 2016-10-27 MED ORDER — ATORVASTATIN CALCIUM 20 MG PO TABS
20.0000 mg | ORAL_TABLET | Freq: Every day | ORAL | Status: DC
  Administered 2016-10-27 – 2016-10-29 (×3): 20 mg via ORAL
  Filled 2016-10-27 (×3): qty 1

## 2016-10-27 MED ORDER — SODIUM CHLORIDE 0.9 % IV BOLUS (SEPSIS)
1000.0000 mL | Freq: Once | INTRAVENOUS | Status: AC
  Administered 2016-10-27: 1000 mL via INTRAVENOUS

## 2016-10-27 MED ORDER — VANCOMYCIN HCL IN DEXTROSE 1-5 GM/200ML-% IV SOLN
1000.0000 mg | Freq: Once | INTRAVENOUS | Status: AC
  Administered 2016-10-27: 1000 mg via INTRAVENOUS
  Filled 2016-10-27: qty 200

## 2016-10-27 MED ORDER — SODIUM CHLORIDE 0.9 % IV SOLN
INTRAVENOUS | Status: AC
  Administered 2016-10-27 – 2016-10-28 (×2): via INTRAVENOUS

## 2016-10-27 MED ORDER — ONDANSETRON HCL 4 MG PO TABS
4.0000 mg | ORAL_TABLET | Freq: Four times a day (QID) | ORAL | Status: DC | PRN
Start: 2016-10-27 — End: 2016-10-30

## 2016-10-27 MED ORDER — ACETAMINOPHEN 325 MG PO TABS
650.0000 mg | ORAL_TABLET | Freq: Once | ORAL | Status: AC
  Administered 2016-10-27: 650 mg via ORAL
  Filled 2016-10-27: qty 2

## 2016-10-27 MED ORDER — BISACODYL 5 MG PO TBEC: 5.0000 mg | DELAYED_RELEASE_TABLET | Freq: Every day | ORAL | Status: DC | PRN

## 2016-10-27 MED ORDER — ENOXAPARIN SODIUM 40 MG/0.4ML ~~LOC~~ SOLN
40.0000 mg | SUBCUTANEOUS | Status: DC
  Administered 2016-10-27 – 2016-10-29 (×3): 40 mg via SUBCUTANEOUS
  Filled 2016-10-27 (×3): qty 0.4

## 2016-10-27 MED ORDER — INSULIN ASPART 100 UNIT/ML ~~LOC~~ SOLN
0.0000 [IU] | Freq: Three times a day (TID) | SUBCUTANEOUS | Status: DC
  Administered 2016-10-27: 2 [IU] via SUBCUTANEOUS
  Administered 2016-10-27 – 2016-10-28 (×2): 5 [IU] via SUBCUTANEOUS
  Administered 2016-10-28: 1 [IU] via SUBCUTANEOUS
  Administered 2016-10-28 – 2016-10-29 (×2): 2 [IU] via SUBCUTANEOUS
  Administered 2016-10-29: 3 [IU] via SUBCUTANEOUS
  Administered 2016-10-30: 2 [IU] via SUBCUTANEOUS
  Administered 2016-10-30: 1 [IU] via SUBCUTANEOUS

## 2016-10-27 MED ORDER — SODIUM CHLORIDE 0.9 % IV SOLN: INTRAVENOUS | Status: AC

## 2016-10-27 MED ORDER — ONDANSETRON HCL 4 MG/2ML IJ SOLN: 4.0000 mg | Freq: Four times a day (QID) | INTRAMUSCULAR | Status: DC | PRN

## 2016-10-27 MED ORDER — ONDANSETRON HCL 4 MG/2ML IJ SOLN
4.0000 mg | Freq: Once | INTRAMUSCULAR | Status: AC
  Administered 2016-10-27: 4 mg via INTRAVENOUS
  Filled 2016-10-27: qty 2

## 2016-10-27 MED ORDER — SODIUM CHLORIDE 0.9 % IV BOLUS (SEPSIS)
500.0000 mL | Freq: Once | INTRAVENOUS | Status: AC
  Administered 2016-10-27: 500 mL via INTRAVENOUS

## 2016-10-27 MED ORDER — INSULIN ASPART 100 UNIT/ML ~~LOC~~ SOLN: 0.0000 [IU] | Freq: Every day | SUBCUTANEOUS | Status: DC

## 2016-10-27 MED ORDER — ACETAMINOPHEN 325 MG PO TABS
650.0000 mg | ORAL_TABLET | Freq: Four times a day (QID) | ORAL | Status: DC | PRN
  Administered 2016-10-27: 650 mg via ORAL
  Filled 2016-10-27: qty 2

## 2016-10-27 MED ORDER — ACETAMINOPHEN 650 MG RE SUPP: 650.0000 mg | Freq: Four times a day (QID) | RECTAL | Status: DC | PRN

## 2016-10-27 MED ORDER — PIPERACILLIN-TAZOBACTAM 3.375 G IVPB
3.3750 g | Freq: Three times a day (TID) | INTRAVENOUS | Status: DC
  Administered 2016-10-27 – 2016-10-30 (×9): 3.375 g via INTRAVENOUS
  Filled 2016-10-27 (×9): qty 50

## 2016-10-27 MED ORDER — INSULIN GLARGINE 100 UNIT/ML ~~LOC~~ SOLN
20.0000 [IU] | Freq: Every day | SUBCUTANEOUS | Status: DC
  Administered 2016-10-27 – 2016-10-29 (×3): 20 [IU] via SUBCUTANEOUS
  Filled 2016-10-27 (×4): qty 0.2

## 2016-10-27 MED ORDER — INSULIN ASPART 100 UNIT/ML ~~LOC~~ SOLN
0.0000 [IU] | Freq: Every day | SUBCUTANEOUS | Status: DC
  Administered 2016-10-28 – 2016-10-29 (×2): 2 [IU] via SUBCUTANEOUS

## 2016-10-27 MED ORDER — POLYETHYLENE GLYCOL 3350 17 G PO PACK: 17.0000 g | PACK | Freq: Every day | ORAL | Status: DC | PRN

## 2016-10-27 MED ORDER — IBUPROFEN 600 MG PO TABS: 600.0000 mg | ORAL_TABLET | Freq: Four times a day (QID) | ORAL | Status: DC | PRN

## 2016-10-27 MED ORDER — IBUPROFEN 400 MG PO TABS
400.0000 mg | ORAL_TABLET | Freq: Once | ORAL | Status: AC
  Administered 2016-10-27: 400 mg via ORAL
  Filled 2016-10-27: qty 1

## 2016-10-27 MED ORDER — PIPERACILLIN-TAZOBACTAM 3.375 G IVPB 30 MIN
3.3750 g | Freq: Once | INTRAVENOUS | Status: AC
  Administered 2016-10-27: 3.375 g via INTRAVENOUS
  Filled 2016-10-27: qty 50

## 2016-10-27 MED ORDER — VANCOMYCIN HCL IN DEXTROSE 1-5 GM/200ML-% IV SOLN
1000.0000 mg | Freq: Two times a day (BID) | INTRAVENOUS | Status: DC
  Administered 2016-10-27 – 2016-10-29 (×6): 1000 mg via INTRAVENOUS
  Filled 2016-10-27 (×6): qty 200

## 2016-10-27 MED ORDER — METFORMIN HCL 500 MG PO TABS
500.0000 mg | ORAL_TABLET | Freq: Two times a day (BID) | ORAL | Status: DC
  Administered 2016-10-27 – 2016-10-30 (×6): 500 mg via ORAL
  Filled 2016-10-27 (×6): qty 1

## 2016-10-27 NOTE — ED Notes (Signed)
Pt informed of need for urine sample.

## 2016-10-27 NOTE — Progress Notes (Signed)
Pharmacy Antibiotic Note  Bryce Stewart is a 51 y.o. male admitted on 10/27/2016 with sepsis.  Pharmacy has been consulted for vancomycin and zosyn dosing.  Vanc 1gm and zosyn 3.375 gm given in ED.  Influenza pcr was negative  Plan: Cont vancomycin 1 gm IV q12 hours Cont zosyn 3.375 gm IV q8 hours F/u renal function, cultures and clinical course  Height: 5' 7.5" (171.5 cm) Weight: 182 lb (82.6 kg) IBW/kg (Calculated) : 67.25  Temp (24hrs), Avg:101.3 F (38.5 C), Min:99.3 F (37.4 C), Max:102.3 F (39.1 C)   Recent Labs Lab 10/27/16 0049 10/27/16 0407  WBC 18.5*  --   CREATININE 1.17  --   LATICACIDVEN  --  1.48    Estimated Creatinine Clearance: 79.3 mL/min (by C-G formula based on SCr of 1.17 mg/dL).    No Known Allergies  Antimicrobials this admission: vanc 3/10 >>  zosyn 3/10 >>   Microbiology results: 3/11 BCx:  3/11 UCx:   3/11 Flu PCR: (-)  Thank you for allowing pharmacy to be a part of this patient's care.  Bryce Stewart, Bryce Stewart 10/27/2016 9:17 AM

## 2016-10-27 NOTE — ED Notes (Signed)
Pt ambulated around nurses station with a steady gait and no complaints.

## 2016-10-27 NOTE — Progress Notes (Signed)
Subjective: Patient was admitted with fever, chills and leucocytosis as possible case of sepsis. He was started on combination of IV antibiotics. Patient feels better today. No nausea or vomiting.  Objective: Vital signs in last 24 hours: Temp:  [99.3 F (37.4 C)-102.3 F (39.1 C)] 99.3 F (37.4 C) (03/11 0433) Pulse Rate:  [95-120] 95 (03/11 0600) Resp:  [12-23] 20 (03/11 0600) BP: (103-149)/(67-87) 103/69 (03/11 0600) SpO2:  [93 %-99 %] 95 % (03/11 0600) Weight:  [82.6 kg (182 lb)] 82.6 kg (182 lb) (03/11 0014) Weight change:     Intake/Output from previous day: No intake/output data recorded.  PHYSICAL EXAM General appearance: alert and no distress Resp: diminished breath sounds bilaterally and rhonchi bilaterally Cardio: S1, S2 normal GI: soft, non-tender; bowel sounds normal; no masses,  no organomegaly Extremities: extremities normal, atraumatic, no cyanosis or edema  Lab Results:  Results for orders placed or performed during the hospital encounter of 10/27/16 (from the past 48 hour(s))  CBG monitoring, ED     Status: Abnormal   Collection Time: 10/27/16 12:29 AM  Result Value Ref Range   Glucose-Capillary 324 (H) 65 - 99 mg/dL  Comprehensive metabolic panel     Status: Abnormal   Collection Time: 10/27/16 12:49 AM  Result Value Ref Range   Sodium 134 (L) 135 - 145 mmol/L   Potassium 4.1 3.5 - 5.1 mmol/L   Chloride 97 (L) 101 - 111 mmol/L   CO2 27 22 - 32 mmol/L   Glucose, Bld 272 (H) 65 - 99 mg/dL   BUN 18 6 - 20 mg/dL   Creatinine, Ser 1.17 0.61 - 1.24 mg/dL   Calcium 9.4 8.9 - 10.3 mg/dL   Total Protein 7.6 6.5 - 8.1 g/dL   Albumin 3.6 3.5 - 5.0 g/dL   AST 24 15 - 41 U/L   ALT 23 17 - 63 U/L   Alkaline Phosphatase 100 38 - 126 U/L   Total Bilirubin 0.7 0.3 - 1.2 mg/dL   GFR calc non Af Amer >60 >60 mL/min   GFR calc Af Amer >60 >60 mL/min    Comment: (NOTE) The eGFR has been calculated using the CKD EPI equation. This calculation has not been validated  in all clinical situations. eGFR's persistently <60 mL/min signify possible Chronic Kidney Disease.    Anion gap 10 5 - 15  CBC WITH DIFFERENTIAL     Status: Abnormal   Collection Time: 10/27/16 12:49 AM  Result Value Ref Range   WBC 18.5 (H) 4.0 - 10.5 K/uL   RBC 5.27 4.22 - 5.81 MIL/uL   Hemoglobin 14.8 13.0 - 17.0 g/dL   HCT 44.3 39.0 - 52.0 %   MCV 84.1 78.0 - 100.0 fL   MCH 28.1 26.0 - 34.0 pg   MCHC 33.4 30.0 - 36.0 g/dL   RDW 14.3 11.5 - 15.5 %   Platelets 233 150 - 400 K/uL   Neutrophils Relative % 89 %   Neutro Abs 16.5 (H) 1.7 - 7.7 K/uL   Lymphocytes Relative 9 %   Lymphs Abs 1.6 0.7 - 4.0 K/uL   Monocytes Relative 2 %   Monocytes Absolute 0.4 0.1 - 1.0 K/uL   Eosinophils Relative 0 %   Eosinophils Absolute 0.0 0.0 - 0.7 K/uL   Basophils Relative 0 %   Basophils Absolute 0.0 0.0 - 0.1 K/uL  Blood Culture (routine x 2)     Status: None (Preliminary result)   Collection Time: 10/27/16  1:15 AM  Result Value  Ref Range   Specimen Description LEFT ANTECUBITAL    Special Requests BOTTLES DRAWN AEROBIC AND ANAEROBIC 6CC    Culture NO GROWTH < 12 HOURS    Report Status PENDING   Blood Culture (routine x 2)     Status: None (Preliminary result)   Collection Time: 10/27/16  1:20 AM  Result Value Ref Range   Specimen Description BLOOD LEFT ARM    Special Requests BOTTLES DRAWN AEROBIC AND ANAEROBIC 6CC    Culture NO GROWTH < 12 HOURS    Report Status PENDING   Blood gas, venous     Status: Abnormal   Collection Time: 10/27/16  3:00 AM  Result Value Ref Range   FIO2 21.00    Delivery systems ROOM AIR    pH, Ven 7.411 7.250 - 7.430   pCO2, Ven 38.5 (L) 44.0 - 60.0 mmHg   pO2, Ven 105.0 (H) 32.0 - 45.0 mmHg   Bicarbonate 24.5 20.0 - 28.0 mmol/L   Acid-Base Excess 0.1 0.0 - 2.0 mmol/L   O2 Saturation 97.4 %   Drawn by [id number]    Sample type VENOUS   Urinalysis, Routine w reflex microscopic     Status: Abnormal   Collection Time: 10/27/16  3:19 AM  Result Value Ref  Range   Color, Urine YELLOW YELLOW   APPearance CLEAR CLEAR   Specific Gravity, Urine 1.018 1.005 - 1.030   pH 5.0 5.0 - 8.0   Glucose, UA >=500 (A) NEGATIVE mg/dL   Hgb urine dipstick SMALL (A) NEGATIVE   Bilirubin Urine NEGATIVE NEGATIVE   Ketones, ur NEGATIVE NEGATIVE mg/dL   Protein, ur 30 (A) NEGATIVE mg/dL   Nitrite NEGATIVE NEGATIVE   Leukocytes, UA SMALL (A) NEGATIVE   RBC / HPF 0-5 0 - 5 RBC/hpf   WBC, UA 6-30 0 - 5 WBC/hpf   Bacteria, UA RARE (A) NONE SEEN  Influenza panel by PCR (type A & B)     Status: None   Collection Time: 10/27/16  3:52 AM  Result Value Ref Range   Influenza A By PCR NEGATIVE NEGATIVE   Influenza B By PCR NEGATIVE NEGATIVE    Comment: (NOTE) The Xpert Xpress Flu assay is intended as an aid in the diagnosis of  influenza and should not be used as a sole basis for treatment.  This  assay is FDA approved for nasopharyngeal swab specimens only. Nasal  washings and aspirates are unacceptable for Xpert Xpress Flu testing.   I-Stat CG4 Lactic Acid, ED  (not at  [facility])     Status: None   Collection Time: 10/27/16  4:07 AM  Result Value Ref Range   Lactic Acid, Venous 1.48 0.5 - 1.9 mmol/L    ABGS  Recent Labs  10/27/16 0300  HCO3 24.5   CULTURES Recent Results (from the past 240 hour(s))  Blood Culture (routine x 2)     Status: None (Preliminary result)   Collection Time: 10/27/16  1:15 AM  Result Value Ref Range Status   Specimen Description LEFT ANTECUBITAL  Final   Special Requests BOTTLES DRAWN AEROBIC AND ANAEROBIC 6CC  Final   Culture NO GROWTH < 12 HOURS  Final   Report Status PENDING  Incomplete  Blood Culture (routine x 2)     Status: None (Preliminary result)   Collection Time: 10/27/16  1:20 AM  Result Value Ref Range Status   Specimen Description BLOOD LEFT ARM  Final   Special Requests BOTTLES DRAWN AEROBIC AND ANAEROBIC 6CC  Final   Culture NO GROWTH < 12 HOURS  Final   Report Status PENDING  Incomplete    Studies/Results: Dg Chest 2 View  Result Date: 10/27/2016 CLINICAL DATA:  Initial evaluation for acute fever, chills, sepsis. EXAM: CHEST  2 VIEW COMPARISON:  None. FINDINGS: The cardiac and mediastinal silhouettes are s within normal limits. The lungs are normally inflated. No airspace consolidation, pleural effusion, or pulmonary edema is identified. There is no pneumothorax. No acute osseous abnormality identified. IMPRESSION: No active cardiopulmonary disease. Electronically Signed   By: Jeannine Boga M.D.   On: 10/27/2016 03:42    Medications: I have reviewed the patient's current medications.  Assesment:  Principal Problem:   Sepsis ([place]) Active Problems:   Diabetes mellitus out of control ([place])   Tobacco abuse   HLD (hyperlipidemia)    Plan:  Medications reviewed Continue combination IV antibiotics for now.  Will monitor CBC/BMP Will follow culture results.   LOS: 0 days   Juquan Reznick 10/27/2016, 10:29 AM

## 2016-10-27 NOTE — ED Notes (Signed)
Pt reports blood sugar in the 400's when he took it at home around 19:00 last night.

## 2016-10-27 NOTE — ED Triage Notes (Signed)
Pt c/o fever, chills, elevated blood sugar that started this evening, pt reports blood sugar over 400 at home,

## 2016-10-27 NOTE — H&P (Signed)
History and Physical    Bryce Stewart ZOX:[id number] DOB: 12/14/1965 DOA: 10/27/2016  PCP: Avon Gully, MD Patient coming from: Home  Chief Complaint: Chills  HPI: Bryce Stewart is a 51 y.o. male with medical history significant of diabetes mellitus, high cholesterol that presents with concerns of flu.  States he has similar symptoms December 2017 and was found to have a kidney infection.  Patient states he was having chills, fevers (temperature of 102 last night), nausea and vomiting, generalized body aches, back pain, dysuria. Denies headaches, rhinorrhea, cough.  Patient states his great niece has been ill recently with flu like illness.  Upon assessment on telemetry patient was finishing his breakfast and denies any pain at this time.  ED Course: Patient was seen and evaluated.  Found to have an elevated WBC of 18.5, UA showing rare bacteria, glucose >500, small Hgb, small leukocytes.  He was then started on Zosyn and vancomycin given fever, tachycardia, tachypnea.  SIRS given unknown source.  Review of Systems: As per HPI otherwise 10 point review of systems negative.    Past Medical History:  Diagnosis Date  . Diabetes mellitus without complication (HCC)   . High cholesterol     History reviewed. No pertinent surgical history.   reports that he has been smoking.  He has been smoking about 0.50 packs per day. He has never used smokeless tobacco. He reports that he does not drink alcohol or use drugs.  No Known Allergies  No family history on file. Grandmother had HTN.  Diabetes "runs thru the family", patient unsure about family history of COPD, asthma, CHF.    Prior to Admission medications   Medication Sig Start Date End Date Taking? Authorizing Provider  atorvastatin (LIPITOR) 20 MG tablet Take 1 tablet (20 mg total) by mouth daily at 6 PM. 07/27/16  Yes Kari Baars, MD  insulin aspart (NOVOLOG) 100 UNIT/ML FlexPen Use qid as sliding scale. Blood sugar less than 1 50-0 units;  blood sugar 1 50-200 2 units; blood sugar 201-250 4 units; blood sugar 2 51-300 6 units; blood sugar 301-350 8 units; blood sugar greater than 350 10 units 07/27/16  Yes Kari Baars, MD  Insulin Glargine (LANTUS) 100 UNIT/ML Solostar Pen Inject 30 Units into the skin daily at 10 pm. 07/27/16  Yes Kari Baars, MD  metFORMIN (GLUCOPHAGE) 500 MG tablet Take 1 tablet (500 mg total) by mouth 2 (two) times daily with a meal. 07/27/16  Yes Kari Baars, MD    Physical Exam: Vitals:   10/27/16 0433 10/27/16 0500 10/27/16 0530 10/27/16 0600  BP:  116/70 108/71 103/69  Pulse:  97 98 95  Resp:  23 23 20   Temp: 99.3 F (37.4 C)     TempSrc: Oral     SpO2:  96% 94% 95%  Weight:      Height:          Constitutional: NAD, calm, comfortable Vitals:   10/27/16 0433 10/27/16 0500 10/27/16 0530 10/27/16 0600  BP:  116/70 108/71 103/69  Pulse:  97 98 95  Resp:  23 23 20   Temp: 99.3 F (37.4 C)     TempSrc: Oral     SpO2:  96% 94% 95%  Weight:      Height:       Eyes: PERRL, lids and conjunctivae normal ENMT: Mucous membranes are moist. Posterior pharynx clear of any exudate or lesions.Normal dentition.  Neck: normal, supple, no masses, no thyromegaly Respiratory: clear to auscultation bilaterally, no wheezing, no crackles.  Normal respiratory effort. No accessory muscle use.  Cardiovascular: Regular rate and rhythm, no murmurs / rubs / gallops. No extremity edema. 2+ pedal pulses. No carotid bruits.  Abdomen: no tenderness, no masses palpated. No hepatosplenomegaly. Bowel sounds positive.  Musculoskeletal: no clubbing / cyanosis. No joint deformity upper and lower extremities. Good ROM, no contractures. Normal muscle tone.  Skin: no rashes, lesions, ulcers. No induration Neurologic: CN 2-12 grossly intact. Sensation intact, DTR normal. Strength 5/5 in all 4.  Psychiatric: Normal judgment and insight. Alert and oriented x 3. Normal mood.     Labs on Admission: I have personally reviewed  following labs and imaging studies  CBC:  Recent Labs Lab 10/27/16 0049  WBC 18.5*  NEUTROABS 16.5*  HGB 14.8  HCT 44.3  MCV 84.1  PLT 233   Basic Metabolic Panel:  Recent Labs Lab 10/27/16 0049  NA 134*  K 4.1  CL 97*  CO2 27  GLUCOSE 272*  BUN 18  CREATININE 1.17  CALCIUM 9.4   GFR: Estimated Creatinine Clearance: 79.3 mL/min (by C-G formula based on SCr of 1.17 mg/dL). Liver Function Tests:  Recent Labs Lab 10/27/16 0049  AST 24  ALT 23  ALKPHOS 100  BILITOT 0.7  PROT 7.6  ALBUMIN 3.6   No results for input(s): LIPASE, AMYLASE in the last 168 hours. No results for input(s): AMMONIA in the last 168 hours. Coagulation Profile: No results for input(s): INR, PROTIME in the last 168 hours. Cardiac Enzymes: No results for input(s): CKTOTAL, CKMB, CKMBINDEX, TROPONINI in the last 168 hours. BNP (last 3 results) No results for input(s): PROBNP in the last 8760 hours. HbA1C: No results for input(s): HGBA1C in the last 72 hours. CBG:  Recent Labs Lab 10/27/16 0029  GLUCAP 324*   Lipid Profile: No results for input(s): CHOL, HDL, LDLCALC, TRIG, CHOLHDL, LDLDIRECT in the last 72 hours. Thyroid Function Tests: No results for input(s): TSH, T4TOTAL, FREET4, T3FREE, THYROIDAB in the last 72 hours. Anemia Panel: No results for input(s): VITAMINB12, FOLATE, FERRITIN, TIBC, IRON, RETICCTPCT in the last 72 hours. Urine analysis:    Component Value Date/Time   COLORURINE YELLOW 10/27/2016 0319   APPEARANCEUR CLEAR 10/27/2016 0319   LABSPEC 1.018 10/27/2016 0319   PHURINE 5.0 10/27/2016 0319   GLUCOSEU >=500 (A) 10/27/2016 0319   HGBUR SMALL (A) 10/27/2016 0319   BILIRUBINUR NEGATIVE 10/27/2016 0319   KETONESUR NEGATIVE 10/27/2016 0319   PROTEINUR 30 (A) 10/27/2016 0319   NITRITE NEGATIVE 10/27/2016 0319   LEUKOCYTESUR SMALL (A) 10/27/2016 0319   Sepsis Labs:  !!!!!!!!!!!!!!!!!!!!!!!!!!!!!!!!!!!!!!!!!!!! @LABRCNTIP (procalcitonin:4,lacticidven:4) ) Recent Results (from the past 240 hour(s))  Blood Culture (routine x 2)     Status: None (Preliminary result)   Collection Time: 10/27/16  1:15 AM  Result Value Ref Range Status   Specimen Description LEFT ANTECUBITAL  Final   Special Requests BOTTLES DRAWN AEROBIC AND ANAEROBIC 6CC  Final   Culture NO GROWTH < 12 HOURS  Final   Report Status PENDING  Incomplete  Blood Culture (routine x 2)     Status: None (Preliminary result)   Collection Time: 10/27/16  1:20 AM  Result Value Ref Range Status   Specimen Description BLOOD LEFT ARM  Final   Special Requests BOTTLES DRAWN AEROBIC AND ANAEROBIC 6CC  Final   Culture NO GROWTH < 12 HOURS  Final   Report Status PENDING  Incomplete     Radiological Exams on Admission: Dg Chest 2 View  Result Date: 10/27/2016 CLINICAL DATA:  Initial  evaluation for acute fever, chills, sepsis. EXAM: CHEST  2 VIEW COMPARISON:  None. FINDINGS: The cardiac and mediastinal silhouettes are s within normal limits. The lungs are normally inflated. No airspace consolidation, pleural effusion, or pulmonary edema is identified. There is no pneumothorax. No acute osseous abnormality identified. IMPRESSION: No active cardiopulmonary disease. Electronically Signed   By: Rise Mu M.D.   On: 10/27/2016 03:42    EKG: Independently reviewed. Tachycardic, left atrial enlargement  Assessment/Plan Principal Problem:   Sepsis (HCC) Active Problems:   Diabetes mellitus out of control (HCC)   Tobacco abuse   HLD (hyperlipidemia)    Sepsis - vancomycin and zosyn - sepsis protocol initiated- received 82ml/kg bolus - continue IVF at 100 ml/hr - follow up urine and blood cultures - tylenol for fever - telemetry  Diabetes Mellitus out of control - HgA1c ordered - SSI - carb modified diet  HLD - continue lipitor   DVT prophylaxis: lovenox Code Status: Full code   Family Communication: no family bedside  Disposition Plan: plan to discharge back home when medically stable  Consults called: None  Admission status: Inpatient, Telemetry    Katrinka Blazing MD [facility] Pager [phone]  If 7PM-7AM, please contact night-coverage [email or web address] Password [facility]  10/27/2016, 7:35 AM

## 2016-10-27 NOTE — ED Notes (Signed)
Pt able to drink 2 cups of water without any nausea or vomiting.

## 2016-10-27 NOTE — ED Provider Notes (Signed)
[facility]-EMERGENCY DEPT Provider Note   CSN: [id number] Arrival date & time: 10/26/16  2354   By signing my name below, I, Bryce Stewart, attest that this documentation has been prepared under the direction and in the presence of Glynn OctaveStephen Beckett Maden, MD. Electronically Signed: Talbert NanPaul Stewart, Scribe. 10/27/16. 12:46 AM.    History   Chief Complaint Chief Complaint  Patient presents with  . Chills    HPI Bryce Stewart is a 51 y.o. male brought in by ambulance, who presents to the Emergency Department complaining of gradual onset, moderate, constant chills that began earlier this evening. Pt has associated subjective fever PTA, generalized body aches, cough, rhinorrhea, dysuria, back pain. Pt has not been drinking any fluids. Pt is a Type II diabetic on insulin on both long term, short term insulin, and metformin. Pt had flu 3-4 weeks ago and was treated with tamiflu and finished regimen. Pt has ulcer on foot. Pt states that he felt okay yesterday. Pt states that he feels similarly to when he was sick in 12/17. Pt is not on any blood thinners. Pt denies emesis, diarrhea, sore throat, neck pain, testicular pain. NKDA.     The history is provided by the patient. No language interpreter was used.    Past Medical History:  Diagnosis Date  . Diabetes mellitus without complication (HCC)   . High cholesterol     Patient Active Problem List   Diagnosis Date Noted  . Diabetes mellitus out of control (HCC) 07/25/2016  . Pyelonephritis 07/25/2016  . Tobacco abuse 07/25/2016  . HLD (hyperlipidemia) 07/25/2016    History reviewed. No pertinent surgical history.     Home Medications    Prior to Admission medications   Medication Sig Start Date End Date Taking? Authorizing Provider  atorvastatin (LIPITOR) 20 MG tablet Take 1 tablet (20 mg total) by mouth daily at 6 PM. 07/27/16  Yes Kari BaarsEdward Hawkins, MD  insulin aspart (NOVOLOG) 100 UNIT/ML FlexPen Use qid as sliding scale. Blood sugar less than 1 50-0  units; blood sugar 1 50-200 2 units; blood sugar 201-250 4 units; blood sugar 2 51-300 6 units; blood sugar 301-350 8 units; blood sugar greater than 350 10 units 07/27/16  Yes Kari BaarsEdward Hawkins, MD  Insulin Glargine (LANTUS) 100 UNIT/ML Solostar Pen Inject 30 Units into the skin daily at 10 pm. 07/27/16  Yes Kari BaarsEdward Hawkins, MD  metFORMIN (GLUCOPHAGE) 500 MG tablet Take 1 tablet (500 mg total) by mouth 2 (two) times daily with a meal. 07/27/16  Yes Kari BaarsEdward Hawkins, MD    Family History No family history on file.  Social History Social History  Substance Use Topics  . Smoking status: Current Every Day Smoker    Packs/day: 0.50  . Smokeless tobacco: Never Used  . Alcohol use No     Allergies   Patient has no known allergies.   Review of Systems Review of Systems   Physical Exam Updated Vital Signs BP 149/87   Pulse 120   Temp 102.3 F (39.1 C) (Oral)   Resp 20   Ht 5' 7.5" (1.715 m)   Wt 182 lb (82.6 kg)   SpO2 99%   BMI 28.08 kg/m   Physical Exam  Constitutional: He is oriented to person, place, and time. He appears well-developed and well-nourished. No distress.  HENT:  Head: Normocephalic and atraumatic.  Mouth/Throat: Oropharynx is clear and moist. No oropharyngeal exudate.  Dry mucus membranes.  Eyes: Conjunctivae and EOM are normal. Pupils are equal, round, and reactive to light.  Neck: Normal range of motion. Neck supple.  No meningismus.  Cardiovascular: Regular rhythm, normal heart sounds and intact distal pulses.   No murmur heard. Tachycardic 110s.  Pulmonary/Chest: Effort normal and breath sounds normal. No respiratory distress.  Abdominal: Soft. There is no tenderness. There is no rebound and no guarding.  Musculoskeletal: Normal range of motion. He exhibits no edema or tenderness.  Neurological: He is alert and oriented to person, place, and time. No cranial nerve deficit. He exhibits normal muscle tone. Coordination normal.   5/5 strength throughout. CN  2-12 intact.Equal grip strength.   Skin: Skin is warm.  No open wounds.  Psychiatric: He has a normal mood and affect. His behavior is normal.  Nursing note and vitals reviewed.    ED Treatments / Results   DIAGNOSTIC STUDIES: Oxygen Saturation is 99% on room air, normal by my interpretation.    COORDINATION OF CARE: 12:42 AM Discussed treatment plan with pt at bedside and pt agreed to plan, which includes nausea medication, Ua, XR.    Labs (all labs ordered are listed, but only abnormal results are displayed) Labs Reviewed  COMPREHENSIVE METABOLIC PANEL - Abnormal; Notable for the following:       Result Value   Sodium 134 (*)    Chloride 97 (*)    Glucose, Bld 272 (*)    All other components within normal limits  CBC WITH DIFFERENTIAL/PLATELET - Abnormal; Notable for the following:    WBC 18.5 (*)    Neutro Abs 16.5 (*)    All other components within normal limits  URINALYSIS, ROUTINE W REFLEX MICROSCOPIC - Abnormal; Notable for the following:    Glucose, UA >=500 (*)    Hgb urine dipstick SMALL (*)    Protein, ur 30 (*)    Leukocytes, UA SMALL (*)    Bacteria, UA RARE (*)    All other components within normal limits  BLOOD GAS, VENOUS - Abnormal; Notable for the following:    pCO2, Ven 38.5 (*)    pO2, Ven 105.0 (*)    All other components within normal limits  GLUCOSE, CAPILLARY - Abnormal; Notable for the following:    Glucose-Capillary 256 (*)    All other components within normal limits  GLUCOSE, CAPILLARY - Abnormal; Notable for the following:    Glucose-Capillary 180 (*)    All other components within normal limits  CBG MONITORING, ED - Abnormal; Notable for the following:    Glucose-Capillary 324 (*)    All other components within normal limits  CULTURE, BLOOD (ROUTINE X 2)  CULTURE, BLOOD (ROUTINE X 2)  URINE CULTURE  INFLUENZA PANEL BY PCR (TYPE A & B)  HIV ANTIBODY (ROUTINE TESTING)  HEMOGLOBIN A1C  CBC  BASIC METABOLIC PANEL  BASIC METABOLIC  PANEL  CBC  I-STAT CG4 LACTIC ACID, ED  I-STAT CG4 LACTIC ACID, ED    EKG  EKG Interpretation  Date/Time:  Sunday October 27 2016 01:16:34 EST Ventricular Rate:  115 PR Interval:    QRS Duration: 80 QT Interval:  284 QTC Calculation: 393 R Axis:   68 Text Interpretation:  Sinus tachycardia Probable left atrial enlargement Minimal ST depression, inferior leads No previous ECGs available Confirmed by Manus Gunning  MD, Slevin Gunby [phone]) on 10/27/2016 1:34:09 AM       Radiology Dg Chest 2 View  Result Date: 10/27/2016 CLINICAL DATA:  Initial evaluation for acute fever, chills, sepsis. EXAM: CHEST  2 VIEW COMPARISON:  None. FINDINGS: The cardiac and mediastinal silhouettes are s  within normal limits. The lungs are normally inflated. No airspace consolidation, pleural effusion, or pulmonary edema is identified. There is no pneumothorax. No acute osseous abnormality identified. IMPRESSION: No active cardiopulmonary disease. Electronically Signed   By: Rise Mu M.D.   On: 10/27/2016 03:42    Procedures Procedures (including critical care time)  Medications Ordered in ED Medications - No data to display   Initial Impression / Assessment and Plan / ED Course  I have reviewed the triage vital signs and the nursing notes.  Pertinent labs & imaging results that were available during my care of the patient were reviewed by me and considered in my medical decision making (see chart for details).     Diabetic patient with one day of fever, chills and hyperglycemia. Denies any cough, runny nose or sore throat. Denies any vomiting.  Patient is febrile and tachycardic. Code Sepsis activated on arrival.  IVF, antibiotics started after cultures obtained. Initial lactate 3. UA not overly impressive for infection. CXR negative.  Labs with leukocytosis and lactic acidosis.  No anion gap. Doubt DKA.  Sepsis with unclear source. Flu swab pending.   BP and mental status stable in the ED.  Denies pain.  Continues to have fever.  Admission for sepsis with elevated lactate d/w Dr. Sharl Ma.  CRITICAL CARE Performed by: Glynn Octave Total critical care time: 35 minutes Critical care time was exclusive of separately billable procedures and treating other patients. Critical care was necessary to treat or prevent imminent or life-threatening deterioration. Critical care was time spent personally by me on the following activities: development of treatment plan with patient and/or surrogate as well as nursing, discussions with consultants, evaluation of patient's response to treatment, examination of patient, obtaining history from patient or surrogate, ordering and performing treatments and interventions, ordering and review of laboratory studies, ordering and review of radiographic studies, pulse oximetry and re-evaluation of patient's condition.    Final Clinical Impressions(s) / ED Diagnoses   Final diagnoses:  Sepsis, due to unspecified organism [facility])  Urinary tract infection without hematuria, site unspecified    New Prescriptions New Prescriptions   No medications on file  I personally performed the services described in this documentation, which was scribed in my presence. The recorded information has been reviewed and is accurate.    Glynn Octave, MD 10/27/16 [phone]

## 2016-10-28 DIAGNOSIS — N390 Urinary tract infection, site not specified: Secondary | ICD-10-CM | POA: Diagnosis present

## 2016-10-28 DIAGNOSIS — Z833 Family history of diabetes mellitus: Secondary | ICD-10-CM | POA: Diagnosis not present

## 2016-10-28 DIAGNOSIS — E7800 Pure hypercholesterolemia, unspecified: Secondary | ICD-10-CM | POA: Diagnosis present

## 2016-10-28 DIAGNOSIS — Z794 Long term (current) use of insulin: Secondary | ICD-10-CM | POA: Diagnosis not present

## 2016-10-28 DIAGNOSIS — Z79899 Other long term (current) drug therapy: Secondary | ICD-10-CM | POA: Diagnosis not present

## 2016-10-28 DIAGNOSIS — E11621 Type 2 diabetes mellitus with foot ulcer: Secondary | ICD-10-CM | POA: Diagnosis present

## 2016-10-28 DIAGNOSIS — E1165 Type 2 diabetes mellitus with hyperglycemia: Secondary | ICD-10-CM | POA: Diagnosis present

## 2016-10-28 DIAGNOSIS — A419 Sepsis, unspecified organism: Secondary | ICD-10-CM | POA: Diagnosis present

## 2016-10-28 DIAGNOSIS — F17210 Nicotine dependence, cigarettes, uncomplicated: Secondary | ICD-10-CM | POA: Diagnosis present

## 2016-10-28 DIAGNOSIS — E785 Hyperlipidemia, unspecified: Secondary | ICD-10-CM | POA: Diagnosis present

## 2016-10-28 LAB — BASIC METABOLIC PANEL
Anion gap: 7 (ref 5–15)
BUN: 19 mg/dL (ref 6–20)
CALCIUM: 8.1 mg/dL — AB (ref 8.9–10.3)
CO2: 25 mmol/L (ref 22–32)
CREATININE: 0.98 mg/dL (ref 0.61–1.24)
Chloride: 104 mmol/L (ref 101–111)
GFR calc Af Amer: >60 mL/min (ref >60)
GFR calc non Af Amer: >60 mL/min (ref >60)
GLUCOSE: 180 mg/dL — AB (ref 65–99)
Potassium: 3.6 mmol/L (ref 3.5–5.1)
Sodium: 136 mmol/L (ref 135–145)

## 2016-10-28 LAB — URINE CULTURE: CULTURE: NO GROWTH

## 2016-10-28 LAB — GLUCOSE, CAPILLARY
GLUCOSE-CAPILLARY: 142 mg/dL — AB (ref 65–99)
GLUCOSE-CAPILLARY: 251 mg/dL — AB (ref 65–99)
GLUCOSE-CAPILLARY: 193 mg/dL — AB (ref 65–99)
GLUCOSE-CAPILLARY: 230 mg/dL — AB (ref 65–99)

## 2016-10-28 LAB — CBC
HCT: 36.7 % — ABNORMAL LOW (ref 39.0–52.0)
Hemoglobin: 12.6 g/dL — ABNORMAL LOW (ref 13.0–17.0)
MCH: 28.6 pg (ref 26.0–34.0)
MCHC: 34.3 g/dL (ref 30.0–36.0)
MCV: 83.4 fL (ref 78.0–100.0)
PLATELETS: 180 10*3/uL (ref 150–400)
RBC: 4.4 MIL/uL (ref 4.22–5.81)
RDW: 14.6 % (ref 11.5–15.5)
WBC: 17 10*3/uL — ABNORMAL HIGH (ref 4.0–10.5)

## 2016-10-28 LAB — HIV ANTIBODY (ROUTINE TESTING W REFLEX): HIV SCREEN 4TH GENERATION: NONREACTIVE

## 2016-10-28 LAB — HEMOGLOBIN A1C
HEMOGLOBIN A1C: 11 % — AB (ref 4.8–5.6)
Mean Plasma Glucose: 269 mg/dL

## 2016-10-28 LAB — CG4 I-STAT (LACTIC ACID): Lactic Acid, Venous: 3.08 mmol/L (ref 0.5–1.9)

## 2016-10-28 NOTE — Progress Notes (Signed)
Subjective: Patient feels better today. His fever and chills are subsiding. His B/p has improving. Blood and urine cultures are so far negative.  Objective: Vital signs in last 24 hours: Temp:  [98.1 F (36.7 C)-103.1 F (39.5 C)] 99.9 F (37.7 C) (03/12 0511) Pulse Rate:  [100-125] 100 (03/12 0511) Resp:  [18-20] 18 (03/12 0511) BP: (117-130)/(65-71) 122/66 (03/12 0511) SpO2:  [98 %-100 %] 100 % (03/12 0511) Weight change:  Last BM Date: 10/27/16  Intake/Output from previous day: 03/11 0701 - 03/12 0700 In: 0254 [P.O.:240; I.V.:1000; IV Piggyback:550] Out: 600 [Urine:600]  PHYSICAL EXAM General appearance: alert and no distress Resp: diminished breath sounds bilaterally and rhonchi bilaterally Cardio: S1, S2 normal GI: soft, non-tender; bowel sounds normal; no masses,  no organomegaly Extremities: extremities normal, atraumatic, no cyanosis or edema  Lab Results:  Results for orders placed or performed during the hospital encounter of 10/27/16 (from the past 48 hour(s))  CBG monitoring, ED     Status: Abnormal   Collection Time: 10/27/16 12:29 AM  Result Value Ref Range   Glucose-Capillary 324 (H) 65 - 99 mg/dL  Comprehensive metabolic panel     Status: Abnormal   Collection Time: 10/27/16 12:49 AM  Result Value Ref Range   Sodium 134 (L) 135 - 145 mmol/L   Potassium 4.1 3.5 - 5.1 mmol/L   Chloride 97 (L) 101 - 111 mmol/L   CO2 27 22 - 32 mmol/L   Glucose, Bld 272 (H) 65 - 99 mg/dL   BUN 18 6 - 20 mg/dL   Creatinine, Ser 1.17 0.61 - 1.24 mg/dL   Calcium 9.4 8.9 - 10.3 mg/dL   Total Protein 7.6 6.5 - 8.1 g/dL   Albumin 3.6 3.5 - 5.0 g/dL   AST 24 15 - 41 U/L   ALT 23 17 - 63 U/L   Alkaline Phosphatase 100 38 - 126 U/L   Total Bilirubin 0.7 0.3 - 1.2 mg/dL   GFR calc non Af Amer >60 >60 mL/min   GFR calc Af Amer >60 >60 mL/min    Comment: (NOTE) The eGFR has been calculated using the CKD EPI equation. This calculation has not been validated in all clinical  situations. eGFR's persistently <60 mL/min signify possible Chronic Kidney Disease.    Anion gap 10 5 - 15  CBC WITH DIFFERENTIAL     Status: Abnormal   Collection Time: 10/27/16 12:49 AM  Result Value Ref Range   WBC 18.5 (H) 4.0 - 10.5 K/uL   RBC 5.27 4.22 - 5.81 MIL/uL   Hemoglobin 14.8 13.0 - 17.0 g/dL   HCT 44.3 39.0 - 52.0 %   MCV 84.1 78.0 - 100.0 fL   MCH 28.1 26.0 - 34.0 pg   MCHC 33.4 30.0 - 36.0 g/dL   RDW 14.3 11.5 - 15.5 %   Platelets 233 150 - 400 K/uL   Neutrophils Relative % 89 %   Neutro Abs 16.5 (H) 1.7 - 7.7 K/uL   Lymphocytes Relative 9 %   Lymphs Abs 1.6 0.7 - 4.0 K/uL   Monocytes Relative 2 %   Monocytes Absolute 0.4 0.1 - 1.0 K/uL   Eosinophils Relative 0 %   Eosinophils Absolute 0.0 0.0 - 0.7 K/uL   Basophils Relative 0 %   Basophils Absolute 0.0 0.0 - 0.1 K/uL  Blood Culture (routine x 2)     Status: None (Preliminary result)   Collection Time: 10/27/16  1:15 AM  Result Value Ref Range   Specimen Description LEFT  ANTECUBITAL    Special Requests BOTTLES DRAWN AEROBIC AND ANAEROBIC 6CC    Culture NO GROWTH < 12 HOURS    Report Status PENDING   Blood Culture (routine x 2)     Status: None (Preliminary result)   Collection Time: 10/27/16  1:20 AM  Result Value Ref Range   Specimen Description BLOOD LEFT ARM    Special Requests BOTTLES DRAWN AEROBIC AND ANAEROBIC 6CC    Culture NO GROWTH < 12 HOURS    Report Status PENDING   Blood gas, venous     Status: Abnormal   Collection Time: 10/27/16  3:00 AM  Result Value Ref Range   FIO2 21.00    Delivery systems ROOM AIR    pH, Ven 7.411 7.250 - 7.430   pCO2, Ven 38.5 (L) 44.0 - 60.0 mmHg   pO2, Ven 105.0 (H) 32.0 - 45.0 mmHg   Bicarbonate 24.5 20.0 - 28.0 mmol/L   Acid-Base Excess 0.1 0.0 - 2.0 mmol/L   O2 Saturation 97.4 %   Drawn by 22223    Sample type VENOUS   Urinalysis, Routine w reflex microscopic     Status: Abnormal   Collection Time: 10/27/16  3:19 AM  Result Value Ref Range   Color,  Urine YELLOW YELLOW   APPearance CLEAR CLEAR   Specific Gravity, Urine 1.018 1.005 - 1.030   pH 5.0 5.0 - 8.0   Glucose, UA >=500 (A) NEGATIVE mg/dL   Hgb urine dipstick SMALL (A) NEGATIVE   Bilirubin Urine NEGATIVE NEGATIVE   Ketones, ur NEGATIVE NEGATIVE mg/dL   Protein, ur 30 (A) NEGATIVE mg/dL   Nitrite NEGATIVE NEGATIVE   Leukocytes, UA SMALL (A) NEGATIVE   RBC / HPF 0-5 0 - 5 RBC/hpf   WBC, UA 6-30 0 - 5 WBC/hpf   Bacteria, UA RARE (A) NONE SEEN  Influenza panel by PCR (type A & B)     Status: None   Collection Time: 10/27/16  3:52 AM  Result Value Ref Range   Influenza A By PCR NEGATIVE NEGATIVE   Influenza B By PCR NEGATIVE NEGATIVE    Comment: (NOTE) The Xpert Xpress Flu assay is intended as an aid in the diagnosis of  influenza and should not be used as a sole basis for treatment.  This  assay is FDA approved for nasopharyngeal swab specimens only. Nasal  washings and aspirates are unacceptable for Xpert Xpress Flu testing.   I-Stat CG4 Lactic Acid, ED  (not at  [facility])     Status: None   Collection Time: 10/27/16  4:07 AM  Result Value Ref Range   Lactic Acid, Venous 1.48 0.5 - 1.9 mmol/L  Glucose, capillary     Status: Abnormal   Collection Time: 10/27/16 11:59 AM  Result Value Ref Range   Glucose-Capillary 256 (H) 65 - 99 mg/dL   Comment 1 Notify RN   Glucose, capillary     Status: Abnormal   Collection Time: 10/27/16  5:50 PM  Result Value Ref Range   Glucose-Capillary 180 (H) 65 - 99 mg/dL   Comment 1 Notify RN   Glucose, capillary     Status: Abnormal   Collection Time: 10/27/16  9:28 PM  Result Value Ref Range   Glucose-Capillary 195 (H) 65 - 99 mg/dL   Comment 1 Notify RN    Comment 2 Document in Chart   Basic metabolic panel     Status: Abnormal   Collection Time: 10/28/16  4:46 AM  Result Value Ref  Range   Sodium 136 135 - 145 mmol/L   Potassium 3.6 3.5 - 5.1 mmol/L   Chloride 104 101 - 111 mmol/L   CO2 25 22 - 32 mmol/L   Glucose, Bld 180 (H) 65  - 99 mg/dL   BUN 19 6 - 20 mg/dL   Creatinine, Ser 0.98 0.61 - 1.24 mg/dL   Calcium 8.1 (L) 8.9 - 10.3 mg/dL   GFR calc non Af Amer >60 >60 mL/min   GFR calc Af Amer >60 >60 mL/min    Comment: (NOTE) The eGFR has been calculated using the CKD EPI equation. This calculation has not been validated in all clinical situations. eGFR's persistently <60 mL/min signify possible Chronic Kidney Disease.    Anion gap 7 5 - 15  CBC     Status: Abnormal   Collection Time: 10/28/16  4:46 AM  Result Value Ref Range   WBC 17.0 (H) 4.0 - 10.5 K/uL   RBC 4.40 4.22 - 5.81 MIL/uL   Hemoglobin 12.6 (L) 13.0 - 17.0 g/dL   HCT 36.7 (L) 39.0 - 52.0 %   MCV 83.4 78.0 - 100.0 fL   MCH 28.6 26.0 - 34.0 pg   MCHC 34.3 30.0 - 36.0 g/dL   RDW 14.6 11.5 - 15.5 %   Platelets 180 150 - 400 K/uL  Glucose, capillary     Status: Abnormal   Collection Time: 10/28/16  7:35 AM  Result Value Ref Range   Glucose-Capillary 142 (H) 65 - 99 mg/dL    ABGS  Recent Labs  10/27/16 0300  HCO3 24.5   CULTURES Recent Results (from the past 240 hour(s))  Blood Culture (routine x 2)     Status: None (Preliminary result)   Collection Time: 10/27/16  1:15 AM  Result Value Ref Range Status   Specimen Description LEFT ANTECUBITAL  Final   Special Requests BOTTLES DRAWN AEROBIC AND ANAEROBIC 6CC  Final   Culture NO GROWTH < 12 HOURS  Final   Report Status PENDING  Incomplete  Blood Culture (routine x 2)     Status: None (Preliminary result)   Collection Time: 10/27/16  1:20 AM  Result Value Ref Range Status   Specimen Description BLOOD LEFT ARM  Final   Special Requests BOTTLES DRAWN AEROBIC AND ANAEROBIC 6CC  Final   Culture NO GROWTH < 12 HOURS  Final   Report Status PENDING  Incomplete   Studies/Results: Dg Chest 2 View  Result Date: 10/27/2016 CLINICAL DATA:  Initial evaluation for acute fever, chills, sepsis. EXAM: CHEST  2 VIEW COMPARISON:  None. FINDINGS: The cardiac and mediastinal silhouettes are s within  normal limits. The lungs are normally inflated. No airspace consolidation, pleural effusion, or pulmonary edema is identified. There is no pneumothorax. No acute osseous abnormality identified. IMPRESSION: No active cardiopulmonary disease. Electronically Signed   By: Jeannine Boga M.D.   On: 10/27/2016 03:42    Medications: I have reviewed the patient's current medications.  Assesment:  Principal Problem:   Sepsis ([place]) Active Problems:   Diabetes mellitus out of control ([place])   Tobacco abuse   HLD (hyperlipidemia)    Plan:  Medications reviewed Continue combination IV antibiotics for now.  Will monitor CBC/BMP Will follow culture results.   LOS: 0 days   Berlin Mokry 10/28/2016, 8:05 AM

## 2016-10-28 NOTE — Care Management Note (Signed)
Case Management Note  Patient Details  Name: Bryce Stewart MRN: [id number] Date of Birth: 05/20/1966  Subjective/Objective:                  Admitted with sepsis. Pt is from home, lives with family and is ind with ADL's. He has PCP, transportation and no insurance with drug coverage. He plans to return home with self care.   Action/Plan: No CM needs anticipated.   Expected Discharge Date:    10/30/2016              Expected Discharge Plan:  Home/Self Care  In-House Referral:  NA  Discharge planning Services  CM Consult  Post Acute Care Choice:  NA Choice offered to:  NA  Status of Service:  Completed, signed off  [name], Amos Gaber Demske, RN 10/28/2016, 2:17 PM

## 2016-10-28 NOTE — Progress Notes (Signed)
Inpatient Diabetes Program Recommendations  AACE/ADA: New Consensus Statement on Inpatient Glycemic Control (2015)  Target Ranges:  Prepandial:   less than 140 mg/dL      Peak postprandial:   less than 180 mg/dL (1-2 hours)      Critically ill patients:  140 - 180 mg/dL   Results for Bryce Stewart, Bryce Stewart (MRN [id number]) as of 10/28/2016 12:14  Ref. Range 10/27/2016 00:29 10/27/2016 11:59 10/27/2016 17:50 10/27/2016 21:28 10/28/2016 07:35 10/28/2016 11:18  Glucose-Capillary Latest Ref Range: 65 - 99 mg/dL 696324 (H) 295256 (H) 284180 (H) 195 (H) 142 (H) 251 (H)   Review of Glycemic Control  Diabetes history: DM2 Outpatient Diabetes medications: Lantus 30 units QAM, Novolog 0-10 units TID with meals, Metformin 500 mg BID Current orders for Inpatient glycemic control: Lantus 20 units daily, Novolog 0-9 units TID with meals, Novolog 0-5 units QHS, Metformin 500 mg BID  Inpatient Diabetes Program Recommendations: HgbA1C: A1C 11.3% on 10/27/16 indicating an average glucose of 269 mg/dl over the past 2-3 months. Patient needs to follow up with PCP regarding DM control.  NOTE: Spoke with patient about diabetes and home regimen for diabetes control. Patient reports that he is followed by PCP for diabetes management and currently he takes Lantus 30 units QAM, Novolog 0-10 units TID with meals, and Metformin 500 mg BID as an outpatient for diabetes control. Patient reports that he is taking Lantus as prescribed but admits that he does not consistently take Novolog with meals as prescribed.  Patient reports that he works 3rd shift and usually takes the Lantus when he get home in the morning or shortly thereafter. Patient states that he does not eat consistently and he does not take the Novolog if he isn't eating a meal.  Patient states that he checks his glucose 2-3 times per day and that it is usually in the 200-300's mg/dl.  Discussed A1C results (11.3% on 10/27/16) and explained that his current A1C indicates an average glucose of  269 mg/dl over the past 2-3 months. Discussed glucose and A1C goals. Discussed importance of checking CBGs and maintaining good CBG control to prevent long-term and short-term complications. Explained how hyperglycemia leads to damage within blood vessels which lead to the common complications seen with uncontrolled diabetes. Stressed to the patient the importance of improving glycemic control to prevent further complications from uncontrolled diabetes. Discussed impact of nutrition, exercise, stress, sickness, and medications on diabetes control. Per chart review, this diabetes coordinator talked with patient on 07/26/16 and patient was not taking insulin due to cost of insulin copays and patient was given a savings card to use along with his insurance to decrease out of pocket cost. Inquired about patient's ability to afford copays for insulin. Patient states that he is able to afford the copays of his insulin at this time and is not having any issue with cost of DM medications. Patient states that he has an appointment with PCP on 10/31/16. Encouraged patient to talk with PCP about DM control, A1C results, and possibly referral to Endocrinologist.  Encouraged patient to check his glucose 3-4 times per day (before meals and at bedtime) and to keep a log book of glucose readings and insulin taken which he will need to take to doctor appointments. Explained how the doctor he follows up with can use the log book to continue to make insulin adjustments if needed. Patient may benefit from being referred to an Endocrinologist to improve DM control as well. Patient verbalized understanding of information discussed and  he states that he has no further questions at this time related to diabetes.  Thanks, Orlando Penner, RN, MSN, CDE Diabetes Coordinator Inpatient Diabetes Program [phone] (Team Pager)

## 2016-10-29 LAB — CBC
HCT: 37.1 % — ABNORMAL LOW (ref 39.0–52.0)
Hemoglobin: 12.4 g/dL — ABNORMAL LOW (ref 13.0–17.0)
MCH: 28 pg (ref 26.0–34.0)
MCHC: 33.4 g/dL (ref 30.0–36.0)
MCV: 83.7 fL (ref 78.0–100.0)
PLATELETS: 172 10*3/uL (ref 150–400)
RBC: 4.43 MIL/uL (ref 4.22–5.81)
RDW: 14.9 % (ref 11.5–15.5)
WBC: 13.6 10*3/uL — AB (ref 4.0–10.5)

## 2016-10-29 LAB — GLUCOSE, CAPILLARY
GLUCOSE-CAPILLARY: 221 mg/dL — AB (ref 65–99)
GLUCOSE-CAPILLARY: 165 mg/dL — AB (ref 65–99)
Glucose-Capillary: 114 mg/dL — ABNORMAL HIGH (ref 65–99)
Glucose-Capillary: 203 mg/dL — ABNORMAL HIGH (ref 65–99)

## 2016-10-29 NOTE — Progress Notes (Signed)
Subjective: Patient is improving. His fever subsided. WBC is down to 13,000. He has intermittent cough. Nonausea or vomiting. Objective: Vital signs in last 24 hours: Temp:  [98.7 F (37.1 C)-98.9 F (37.2 C)] 98.7 F (37.1 C) (03/13 0658) Pulse Rate:  [76-85] 81 (03/13 0658) Resp:  [18] 18 (03/13 0658) BP: (114-133)/(74-79) 133/74 (03/13 0658) SpO2:  [98 %-100 %] 98 % (03/13 0658) Weight change:  Last BM Date: 10/27/16  Intake/Output from previous day: 03/12 0701 - 03/13 0700 In: 2760 [P.O.:960; I.V.:1250; IV Piggyback:550] Out: -   PHYSICAL EXAM General appearance: alert and no distress Resp: diminished breath sounds bilaterally and rhonchi bilaterally Cardio: S1, S2 normal GI: soft, non-tender; bowel sounds normal; no masses,  no organomegaly Extremities: extremities normal, atraumatic, no cyanosis or edema  Lab Results:  Results for orders placed or performed during the hospital encounter of 10/27/16 (from the past 48 hour(s))  Glucose, capillary     Status: Abnormal   Collection Time: 10/27/16 11:59 AM  Result Value Ref Range   Glucose-Capillary 256 (H) 65 - 99 mg/dL   Comment 1 Notify RN   Glucose, capillary     Status: Abnormal   Collection Time: 10/27/16  5:50 PM  Result Value Ref Range   Glucose-Capillary 180 (H) 65 - 99 mg/dL   Comment 1 Notify RN   Glucose, capillary     Status: Abnormal   Collection Time: 10/27/16  9:28 PM  Result Value Ref Range   Glucose-Capillary 195 (H) 65 - 99 mg/dL   Comment 1 Notify RN    Comment 2 Document in Chart   Basic metabolic panel     Status: Abnormal   Collection Time: 10/28/16  4:46 AM  Result Value Ref Range   Sodium 136 135 - 145 mmol/L   Potassium 3.6 3.5 - 5.1 mmol/L   Chloride 104 101 - 111 mmol/L   CO2 25 22 - 32 mmol/L   Glucose, Bld 180 (H) 65 - 99 mg/dL   BUN 19 6 - 20 mg/dL   Creatinine, Ser 0.98 0.61 - 1.24 mg/dL   Calcium 8.1 (L) 8.9 - 10.3 mg/dL   GFR calc non Af Amer >60 >60 mL/min   GFR calc Af  Amer >60 >60 mL/min    Comment: (NOTE) The eGFR has been calculated using the CKD EPI equation. This calculation has not been validated in all clinical situations. eGFR's persistently <60 mL/min signify possible Chronic Kidney Disease.    Anion gap 7 5 - 15  CBC     Status: Abnormal   Collection Time: 10/28/16  4:46 AM  Result Value Ref Range   WBC 17.0 (H) 4.0 - 10.5 K/uL   RBC 4.40 4.22 - 5.81 MIL/uL   Hemoglobin 12.6 (L) 13.0 - 17.0 g/dL   HCT 36.7 (L) 39.0 - 52.0 %   MCV 83.4 78.0 - 100.0 fL   MCH 28.6 26.0 - 34.0 pg   MCHC 34.3 30.0 - 36.0 g/dL   RDW 14.6 11.5 - 15.5 %   Platelets 180 150 - 400 K/uL  Glucose, capillary     Status: Abnormal   Collection Time: 10/28/16  7:35 AM  Result Value Ref Range   Glucose-Capillary 142 (H) 65 - 99 mg/dL  Glucose, capillary     Status: Abnormal   Collection Time: 10/28/16 11:18 AM  Result Value Ref Range   Glucose-Capillary 251 (H) 65 - 99 mg/dL  Glucose, capillary     Status: Abnormal   Collection Time:  10/28/16  4:18 PM  Result Value Ref Range   Glucose-Capillary 193 (H) 65 - 99 mg/dL  Glucose, capillary     Status: Abnormal   Collection Time: 10/28/16 10:03 PM  Result Value Ref Range   Glucose-Capillary 230 (H) 65 - 99 mg/dL   Comment 1 Notify RN    Comment 2 Document in Chart   CBC     Status: Abnormal   Collection Time: 10/29/16  4:34 AM  Result Value Ref Range   WBC 13.6 (H) 4.0 - 10.5 K/uL   RBC 4.43 4.22 - 5.81 MIL/uL   Hemoglobin 12.4 (L) 13.0 - 17.0 g/dL   HCT 37.1 (L) 39.0 - 52.0 %   MCV 83.7 78.0 - 100.0 fL   MCH 28.0 26.0 - 34.0 pg   MCHC 33.4 30.0 - 36.0 g/dL   RDW 14.9 11.5 - 15.5 %   Platelets 172 150 - 400 K/uL  Glucose, capillary     Status: Abnormal   Collection Time: 10/29/16  7:50 AM  Result Value Ref Range   Glucose-Capillary 114 (H) 65 - 99 mg/dL    ABGS  Recent Labs  10/27/16 0300  HCO3 24.5   CULTURES Recent Results (from the past 240 hour(s))  Blood Culture (routine x 2)     Status:  None (Preliminary result)   Collection Time: 10/27/16  1:15 AM  Result Value Ref Range Status   Specimen Description LEFT ANTECUBITAL  Final   Special Requests BOTTLES DRAWN AEROBIC AND ANAEROBIC 6CC  Final   Culture NO GROWTH 1 DAY  Final   Report Status PENDING  Incomplete  Blood Culture (routine x 2)     Status: None (Preliminary result)   Collection Time: 10/27/16  1:20 AM  Result Value Ref Range Status   Specimen Description BLOOD LEFT ARM  Final   Special Requests BOTTLES DRAWN AEROBIC AND ANAEROBIC 6CC  Final   Culture NO GROWTH 1 DAY  Final   Report Status PENDING  Incomplete  Urine culture     Status: None   Collection Time: 10/27/16  3:19 AM  Result Value Ref Range Status   Specimen Description URINE, CLEAN CATCH  Final   Special Requests NONE  Final   Culture   Final    NO GROWTH Performed at [facility], [address], [place],  [address]    Report Status 10/28/2016 FINAL  Final   Studies/Results: No results found.  Medications: I have reviewed the patient's current medications.  Assesment:  Principal Problem:   Sepsis ([place]) Active Problems:   Diabetes mellitus out of control ([place])   Tobacco abuse   HLD (hyperlipidemia)    Plan:  Medications reviewed Continue combination IV antibiotics for now.  Repeat CBC Will follow culture results.   LOS: 1 day   Bryce Stewart 10/29/2016, 8:11 AM

## 2016-10-29 NOTE — Progress Notes (Signed)
Inpatient Diabetes Program Recommendations  AACE/ADA: New Consensus Statement on Inpatient Glycemic Control (2015)  Target Ranges:  Prepandial:   less than 140 mg/dL      Peak postprandial:   less than 180 mg/dL (1-2 hours)      Critically ill patients:  140 - 180 mg/dL   Results for Bryce Stewart, Bryce Stewart (MRN [id number]) as of 10/29/2016 08:30  Ref. Range 10/28/2016 07:35 10/28/2016 11:18 10/28/2016 16:18 10/28/2016 22:03 10/29/2016 07:50  Glucose-Capillary Latest Ref Range: 65 - 99 mg/dL 409142 (H) 811251 (H) 914193 (H) 230 (H) 114 (H)   Review of Glycemic Control  Diabetes history: DM2 Outpatient Diabetes medications: Lantus 30 units QAM, Novolog 0-10 units TID with meals, Metformin 500 mg BID Current orders for Inpatient glycemic control: Lantus 20 units daily, Novolog 0-9 units TID with meals, Novolog 0-5 units QHS, Metformin 500 mg BID  Inpatient Diabetes Program Recommendations: Insulin - Meal Coverage: Please consider ordering Novolog 3 units TID with meals for meal coverage if patient eats at least 50% of meals. HgbA1C: A1C 11.3% on 10/27/16 indicating an average glucose of 269 mg/dl over the past 2-3 months. Patient needs to follow up with PCP regarding DM control; recommend referral to Endocrinologist.  Thanks, Bryce PennerMarie Delfina Schreurs, RN, MSN, CDE Diabetes Coordinator Inpatient Diabetes Program [phone] (Team Pager from 8am to 5pm)

## 2016-10-30 LAB — CBC
HEMATOCRIT: 36.5 % — AB (ref 39.0–52.0)
Hemoglobin: 12.1 g/dL — ABNORMAL LOW (ref 13.0–17.0)
MCH: 27.1 pg (ref 26.0–34.0)
MCHC: 33.2 g/dL (ref 30.0–36.0)
MCV: 81.7 fL (ref 78.0–100.0)
Platelets: 187 10*3/uL (ref 150–400)
RBC: 4.47 MIL/uL (ref 4.22–5.81)
RDW: 14.6 % (ref 11.5–15.5)
WBC: 12.5 10*3/uL — ABNORMAL HIGH (ref 4.0–10.5)

## 2016-10-30 LAB — GLUCOSE, CAPILLARY
Glucose-Capillary: 130 mg/dL — ABNORMAL HIGH (ref 65–99)
Glucose-Capillary: 171 mg/dL — ABNORMAL HIGH (ref 65–99)

## 2016-10-30 MED ORDER — AMOXICILLIN-POT CLAVULANATE 500-125 MG PO TABS: 1.0000 | ORAL_TABLET | Freq: Three times a day (TID) | ORAL | 0 refills | Status: DC

## 2016-10-30 NOTE — Progress Notes (Signed)
Patient discharged home.  IVs removed - WNL.  Reviewed DC instructions and medications.  Emphasized importance of completing entire dose of ABx to prevent further symptoms.  Follow up in place with PCP.  Patient verbalized understanding.  No questions at this time, assisted off unit by NT in NAD

## 2016-10-30 NOTE — Progress Notes (Signed)
Inpatient Diabetes Program Recommendations  AACE/ADA: New Consensus Statement on Inpatient Glycemic Control (2015)  Target Ranges:  Prepandial:   less than 140 mg/dL      Peak postprandial:   less than 180 mg/dL (1-2 hours)      Critically ill patients:  140 - 180 mg/dL   Results for Bryce Stewart, Bryce Stewart (MRN [id number]) as of 10/30/2016 10:33  Ref. Range 10/29/2016 07:50 10/29/2016 11:42 10/29/2016 16:29 10/29/2016 22:02  Glucose-Capillary Latest Ref Range: 65 - 99 mg/dL 829114 (H) 562221 (H) 130165 (H) 203 (H)    Home DM Meds: Lantus 30 units QHS       Novolog 0-10 units QID per SSI       Metformin 500 mg BID  Current Orders: Lantus 20 units QHS      Novolog Sensitive Correction Scale/ SSI (0-9 units) TID AC + HS      Metformin 500 mg BID        MD- Please consider ordering Novolog 3 units TID with meals for meal coverage if patient eats at least 50% of meals.  A1C 11.3% on 10/27/16- Patient needs to follow up with PCP regarding DM control; recommend referral to Endocrinologist.     --Will follow patient during hospitalization--  Ambrose FinlandJeannine Johnston [name] RN, MSN, CDE Diabetes Coordinator Inpatient Glycemic Control Team Team Pager: [phone] (8a-5p)

## 2016-10-30 NOTE — Discharge Summary (Signed)
Physician Discharge Summary  Patient ID: Meridee ScoreMark Cordaro MRN: [id number] DOB/AGE: 49/09/1965 49 y.o. Primary Care Physician:Joeseph Verville, MD Admit date: 10/27/2016 Discharge date: 10/30/2016    Discharge Diagnoses:   Principal Problem:   Sepsis [facility](HCC) Active Problems:   Diabetes mellitus out of control (HCC)   Tobacco abuse   HLD (hyperlipidemia)   Allergies as of 10/30/2016   No Known Allergies     Medication List    TAKE these medications   amoxicillin-clavulanate 500-125 MG tablet Commonly known as:  AUGMENTIN Take 1 tablet (500 mg total) by mouth 3 (three) times daily.   atorvastatin 20 MG tablet Commonly known as:  LIPITOR Take 1 tablet (20 mg total) by mouth daily at 6 PM.   Insulin Glargine 100 UNIT/ML Solostar Pen Commonly known as:  LANTUS Inject 30 Units into the skin daily at 10 pm.   metFORMIN 500 MG tablet Commonly known as:  GLUCOPHAGE Take 1 tablet (500 mg total) by mouth 2 (two) times daily with a meal.       Discharged Condition: home    Consults: none  Significant Diagnostic Studies: Dg Chest 2 View  Result Date: 10/27/2016 CLINICAL DATA:  Initial evaluation for acute fever, chills, sepsis. EXAM: CHEST  2 VIEW COMPARISON:  None. FINDINGS: The cardiac and mediastinal silhouettes are s within normal limits. The lungs are normally inflated. No airspace consolidation, pleural effusion, or pulmonary edema is identified. There is no pneumothorax. No acute osseous abnormality identified. IMPRESSION: No active cardiopulmonary disease. Electronically Signed   By: Rise MuBenjamin  McClintock M.D.   On: 10/27/2016 03:42    Lab Results: Basic Metabolic Panel:  Recent Labs  11/91/4702/08/05 0446  NA 136  K 3.6  CL 104  CO2 25  GLUCOSE 180*  BUN 19  CREATININE 0.98  CALCIUM 8.1*   Liver Function Tests: No results for input(s): AST, ALT, ALKPHOS, BILITOT, PROT, ALBUMIN in the last 72 hours.   CBC:  Recent Labs  10/29/16 0434 10/30/16 0447  WBC 13.6* 12.5*   HGB 12.4* 12.1*  HCT 37.1* 36.5*  MCV 83.7 81.7  PLT 172 187    Recent Results (from the past 240 hour(s))  Blood Culture (routine x 2)     Status: None (Preliminary result)   Collection Time: 10/27/16  1:15 AM  Result Value Ref Range Status   Specimen Description LEFT ANTECUBITAL  Final   Special Requests BOTTLES DRAWN AEROBIC AND ANAEROBIC 6CC  Final   Culture NO GROWTH 2 DAYS  Final   Report Status PENDING  Incomplete  Blood Culture (routine x 2)     Status: None (Preliminary result)   Collection Time: 10/27/16  1:20 AM  Result Value Ref Range Status   Specimen Description BLOOD LEFT ARM  Final   Special Requests BOTTLES DRAWN AEROBIC AND ANAEROBIC 6CC  Final   Culture NO GROWTH 2 DAYS  Final   Report Status PENDING  Incomplete  Urine culture     Status: None   Collection Time: 10/27/16  3:19 AM  Result Value Ref Range Status   Specimen Description URINE, CLEAN CATCH  Final   Special Requests NONE  Final   Culture   Final    NO GROWTH Performed at [facility], [address] [place], [place] [address]    Report Status 10/28/2016 FINAL  Final     Hospital Course:   This is a 51  Years old male was admitted due to fever, chills and hypotension. He wasadmitted as case  of sepsis and was treated according protocol. He improved. His fever,subsided. His cultures remained negative. Patient is being discharged on oral antibiotics to be followed in the office.  Discharge Exam: Blood pressure 121/77, pulse 76, temperature 100 F (37.8 C), temperature source Oral, resp. rate 15, height 5' 7.5" (1.715 m), weight 82.6 kg (182 lb), SpO2 97 %.    Disposition:  home      Signed: Katherine Syme   10/30/2016, 8:09 AM

## 2016-11-01 LAB — CULTURE, BLOOD (ROUTINE X 2)
CULTURE: NO GROWTH
Culture: NO GROWTH

## 2016-12-02 ENCOUNTER — Ambulatory Visit (HOSPITAL_BASED_OUTPATIENT_CLINIC_OR_DEPARTMENT_OTHER): Payer: Managed Care, Other (non HMO) | Admitting: Anesthesiology

## 2016-12-02 ENCOUNTER — Encounter (HOSPITAL_BASED_OUTPATIENT_CLINIC_OR_DEPARTMENT_OTHER): Payer: Self-pay | Admitting: Anesthesiology

## 2016-12-02 ENCOUNTER — Encounter (HOSPITAL_BASED_OUTPATIENT_CLINIC_OR_DEPARTMENT_OTHER)
Admission: RE | Disposition: A | Discharge status: Home / Self Care | Payer: Self-pay | Source: Ambulatory Visit | Attending: Urology

## 2016-12-02 ENCOUNTER — Other Ambulatory Visit: Payer: Self-pay | Admitting: Urology

## 2016-12-02 ENCOUNTER — Ambulatory Visit (HOSPITAL_BASED_OUTPATIENT_CLINIC_OR_DEPARTMENT_OTHER)
Admission: RE | Admit: 2016-12-02 | Discharge: 2016-12-02 | Disposition: A | Discharge status: Home / Self Care | Payer: Managed Care, Other (non HMO) | Source: Ambulatory Visit | Attending: Urology | Admitting: Urology

## 2016-12-02 DIAGNOSIS — N529 Male erectile dysfunction, unspecified: Secondary | ICD-10-CM | POA: Insufficient documentation

## 2016-12-02 DIAGNOSIS — N500 Atrophy of testis: Secondary | ICD-10-CM | POA: Insufficient documentation

## 2016-12-02 DIAGNOSIS — R3915 Urgency of urination: Secondary | ICD-10-CM | POA: Diagnosis not present

## 2016-12-02 DIAGNOSIS — R3912 Poor urinary stream: Secondary | ICD-10-CM | POA: Diagnosis not present

## 2016-12-02 DIAGNOSIS — Z794 Long term (current) use of insulin: Secondary | ICD-10-CM | POA: Diagnosis not present

## 2016-12-02 DIAGNOSIS — E1142 Type 2 diabetes mellitus with diabetic polyneuropathy: Secondary | ICD-10-CM | POA: Diagnosis not present

## 2016-12-02 DIAGNOSIS — Z8349 Family history of other endocrine, nutritional and metabolic diseases: Secondary | ICD-10-CM | POA: Insufficient documentation

## 2016-12-02 DIAGNOSIS — Z8051 Family history of malignant neoplasm of kidney: Secondary | ICD-10-CM | POA: Diagnosis not present

## 2016-12-02 DIAGNOSIS — F17200 Nicotine dependence, unspecified, uncomplicated: Secondary | ICD-10-CM | POA: Diagnosis not present

## 2016-12-02 DIAGNOSIS — N412 Abscess of prostate: Secondary | ICD-10-CM | POA: Insufficient documentation

## 2016-12-02 DIAGNOSIS — Z87440 Personal history of urinary (tract) infections: Secondary | ICD-10-CM | POA: Diagnosis not present

## 2016-12-02 DIAGNOSIS — I771 Stricture of artery: Secondary | ICD-10-CM | POA: Diagnosis not present

## 2016-12-02 DIAGNOSIS — Z79899 Other long term (current) drug therapy: Secondary | ICD-10-CM | POA: Insufficient documentation

## 2016-12-02 DIAGNOSIS — R351 Nocturia: Secondary | ICD-10-CM | POA: Diagnosis not present

## 2016-12-02 DIAGNOSIS — E7800 Pure hypercholesterolemia, unspecified: Secondary | ICD-10-CM | POA: Insufficient documentation

## 2016-12-02 DIAGNOSIS — R3911 Hesitancy of micturition: Secondary | ICD-10-CM | POA: Diagnosis not present

## 2016-12-02 DIAGNOSIS — I10 Essential (primary) hypertension: Secondary | ICD-10-CM | POA: Diagnosis not present

## 2016-12-02 DIAGNOSIS — Z833 Family history of diabetes mellitus: Secondary | ICD-10-CM | POA: Insufficient documentation

## 2016-12-02 DIAGNOSIS — N359 Urethral stricture, unspecified: Secondary | ICD-10-CM | POA: Insufficient documentation

## 2016-12-02 HISTORY — PX: CYSTOSCOPY WITH URETHRAL DILATATION: SHX5125

## 2016-12-02 HISTORY — PX: CYSTOSCOPY: SHX5120

## 2016-12-02 HISTORY — PX: TRANSURETHRAL RESECTION OF PROSTATE: SHX73

## 2016-12-02 LAB — POCT I-STAT, CHEM 8
BUN: 20 mg/dL (ref 6–20)
CREATININE: 0.9 mg/dL (ref 0.61–1.24)
Calcium, Ion: 1.22 mmol/L (ref 1.15–1.40)
Chloride: 100 mmol/L — ABNORMAL LOW (ref 101–111)
Glucose, Bld: 175 mg/dL — ABNORMAL HIGH (ref 65–99)
HEMATOCRIT: 47 % (ref 39.0–52.0)
HEMOGLOBIN: 16 g/dL (ref 13.0–17.0)
Potassium: 4.5 mmol/L (ref 3.5–5.1)
SODIUM: 137 mmol/L (ref 135–145)
TCO2: 31 mmol/L (ref 0–100)

## 2016-12-02 LAB — GLUCOSE, CAPILLARY: GLUCOSE-CAPILLARY: 181 mg/dL — AB (ref 65–99)

## 2016-12-02 SURGERY — CYSTOSCOPY
Anesthesia: General | Site: Prostate

## 2016-12-02 MED ORDER — DEXAMETHASONE SODIUM PHOSPHATE 4 MG/ML IJ SOLN
INTRAMUSCULAR | Status: DC | PRN
  Administered 2016-12-02: 10 mg via INTRAVENOUS

## 2016-12-02 MED ORDER — ONDANSETRON HCL 4 MG/2ML IJ SOLN
INTRAMUSCULAR | Status: AC
  Filled 2016-12-02: qty 2

## 2016-12-02 MED ORDER — SODIUM CHLORIDE 0.9% FLUSH
3.0000 mL | INTRAVENOUS | Status: DC | PRN
  Filled 2016-12-02: qty 3

## 2016-12-02 MED ORDER — FENTANYL CITRATE (PF) 100 MCG/2ML IJ SOLN
25.0000 ug | INTRAMUSCULAR | Status: DC | PRN
  Administered 2016-12-02 (×2): 50 ug via INTRAVENOUS
  Filled 2016-12-02: qty 1

## 2016-12-02 MED ORDER — CEFAZOLIN SODIUM-DEXTROSE 2-4 GM/100ML-% IV SOLN
INTRAVENOUS | Status: AC
  Filled 2016-12-02: qty 100

## 2016-12-02 MED ORDER — PROPOFOL 10 MG/ML IV BOLUS
INTRAVENOUS | Status: DC | PRN
  Administered 2016-12-02: 200 mg via INTRAVENOUS

## 2016-12-02 MED ORDER — PROPOFOL 10 MG/ML IV BOLUS
INTRAVENOUS | Status: AC
  Filled 2016-12-02: qty 40

## 2016-12-02 MED ORDER — SODIUM CHLORIDE 0.9 % IR SOLN
Status: DC | PRN
  Administered 2016-12-02: 9000 mL/h via INTRAVESICAL

## 2016-12-02 MED ORDER — ACETAMINOPHEN 10 MG/ML IV SOLN
INTRAVENOUS | Status: DC | PRN
Start: 2016-12-02 — End: 2016-12-02
  Administered 2016-12-02: 1000 mg via INTRAVENOUS

## 2016-12-02 MED ORDER — LACTATED RINGERS IV SOLN
INTRAVENOUS | Status: DC
  Administered 2016-12-02 (×2): via INTRAVENOUS
  Filled 2016-12-02: qty 1000

## 2016-12-02 MED ORDER — LIDOCAINE 2% (20 MG/ML) 5 ML SYRINGE
INTRAMUSCULAR | Status: AC
  Filled 2016-12-02: qty 5

## 2016-12-02 MED ORDER — MIDAZOLAM HCL 5 MG/5ML IJ SOLN
INTRAMUSCULAR | Status: DC | PRN
  Administered 2016-12-02: 2 mg via INTRAVENOUS

## 2016-12-02 MED ORDER — SODIUM CHLORIDE 0.9% FLUSH
3.0000 mL | Freq: Two times a day (BID) | INTRAVENOUS | Status: DC
  Filled 2016-12-02: qty 3

## 2016-12-02 MED ORDER — OXYCODONE HCL 5 MG PO TABS
5.0000 mg | ORAL_TABLET | ORAL | Status: DC | PRN
  Administered 2016-12-02: 5 mg via ORAL
  Filled 2016-12-02: qty 2

## 2016-12-02 MED ORDER — MORPHINE SULFATE (PF) 2 MG/ML IV SOLN
2.0000 mg | INTRAVENOUS | Status: DC | PRN
  Filled 2016-12-02: qty 1

## 2016-12-02 MED ORDER — FENTANYL CITRATE (PF) 100 MCG/2ML IJ SOLN
INTRAMUSCULAR | Status: DC | PRN
  Administered 2016-12-02 (×4): 50 ug via INTRAVENOUS

## 2016-12-02 MED ORDER — HYDROCODONE-ACETAMINOPHEN 5-325 MG PO TABS: 1.0000 | ORAL_TABLET | Freq: Four times a day (QID) | ORAL | 0 refills | Status: DC | PRN

## 2016-12-02 MED ORDER — OXYCODONE HCL 5 MG PO TABS
ORAL_TABLET | ORAL | Status: AC
  Filled 2016-12-02: qty 1

## 2016-12-02 MED ORDER — DEXAMETHASONE SODIUM PHOSPHATE 10 MG/ML IJ SOLN
INTRAMUSCULAR | Status: AC
  Filled 2016-12-02: qty 1

## 2016-12-02 MED ORDER — CEPHALEXIN 500 MG PO CAPS: 500.0000 mg | ORAL_CAPSULE | Freq: Four times a day (QID) | ORAL | 0 refills | Status: DC

## 2016-12-02 MED ORDER — PROMETHAZINE HCL 25 MG/ML IJ SOLN
6.2500 mg | INTRAMUSCULAR | Status: DC | PRN
  Filled 2016-12-02: qty 1

## 2016-12-02 MED ORDER — SODIUM CHLORIDE 0.9 % IV SOLN
250.0000 mL | INTRAVENOUS | Status: DC | PRN
  Filled 2016-12-02: qty 250

## 2016-12-02 MED ORDER — ACETAMINOPHEN 325 MG PO TABS
650.0000 mg | ORAL_TABLET | ORAL | Status: DC | PRN
  Filled 2016-12-02: qty 2

## 2016-12-02 MED ORDER — CEFAZOLIN SODIUM-DEXTROSE 2-4 GM/100ML-% IV SOLN
2.0000 g | INTRAVENOUS | Status: AC
  Administered 2016-12-02: 2 g via INTRAVENOUS
  Filled 2016-12-02: qty 100

## 2016-12-02 MED ORDER — FENTANYL CITRATE (PF) 100 MCG/2ML IJ SOLN
INTRAMUSCULAR | Status: AC
  Filled 2016-12-02: qty 2

## 2016-12-02 MED ORDER — LIDOCAINE HCL (CARDIAC) 20 MG/ML IV SOLN
INTRAVENOUS | Status: DC | PRN
  Administered 2016-12-02: 80 mg via INTRAVENOUS
  Administered 2016-12-02: 20 mg via INTRAVENOUS

## 2016-12-02 MED ORDER — DEXTROSE 5 % IV SOLN
5.0000 mg/kg | INTRAVENOUS | Status: AC
  Administered 2016-12-02: 380 mg via INTRAVENOUS
  Filled 2016-12-02 (×2): qty 9.5

## 2016-12-02 MED ORDER — ACETAMINOPHEN 650 MG RE SUPP
650.0000 mg | RECTAL | Status: DC | PRN
  Filled 2016-12-02: qty 1

## 2016-12-02 MED ORDER — GENTAMICIN SULFATE 40 MG/ML IJ SOLN
5.0000 mg/kg | INTRAVENOUS | Status: DC
  Filled 2016-12-02: qty 10.25

## 2016-12-02 MED ORDER — MIDAZOLAM HCL 2 MG/2ML IJ SOLN
INTRAMUSCULAR | Status: AC
  Filled 2016-12-02: qty 2

## 2016-12-02 SURGICAL SUPPLY — 32 items
BAG DRAIN URO-CYSTO SKYTR STRL (DRAIN) ×4 IMPLANT
BAG URINE DRAINAGE (UROLOGICAL SUPPLIES) ×4 IMPLANT
BAG URINE LEG 19OZ MD ST LTX (BAG) IMPLANT
CATH FOLEY 2WAY SLVR  5CC 20FR (CATHETERS)
CATH FOLEY 2WAY SLVR  5CC 22FR (CATHETERS) ×2
CATH FOLEY 2WAY SLVR 30CC 20FR (CATHETERS) ×4 IMPLANT
CATH FOLEY 2WAY SLVR 30CC 22FR (CATHETERS) ×4 IMPLANT
CATH FOLEY 2WAY SLVR 5CC 20FR (CATHETERS) IMPLANT
CATH FOLEY 2WAY SLVR 5CC 22FR (CATHETERS) ×2 IMPLANT
CATH HEMA 3WAY 30CC 24FR COUDE (CATHETERS) IMPLANT
CATH HEMA 3WAY 30CC 24FR RND (CATHETERS) IMPLANT
CLOTH BEACON ORANGE TIMEOUT ST (SAFETY) ×4 IMPLANT
ELECT HOOK LOOP BIPOLAR (NEEDLE) ×4 IMPLANT
ELECT LOOP MED HF 24F 12D (CUTTING LOOP) ×4 IMPLANT
ELECT REM PT RETURN 9FT ADLT (ELECTROSURGICAL) ×4
ELECTRODE REM PT RTRN 9FT ADLT (ELECTROSURGICAL) ×2 IMPLANT
GLOVE SURG SS PI 8.0 STRL IVOR (GLOVE) ×4 IMPLANT
GLOVE SURG SS PI 8.5 STRL IVOR (GLOVE) ×2
GLOVE SURG SS PI 8.5 STRL STRW (GLOVE) ×2 IMPLANT
GOWN STRL REUS W/ TWL XL LVL3 (GOWN DISPOSABLE) ×2 IMPLANT
GOWN STRL REUS W/TWL XL LVL3 (GOWN DISPOSABLE) ×2
GUIDEWIRE STR DUAL SENSOR (WIRE) ×4 IMPLANT
HOLDER FOLEY CATH W/STRAP (MISCELLANEOUS) ×4 IMPLANT
KIT RM TURNOVER CYSTO AR (KITS) ×4 IMPLANT
LOOP CUT BIPOLAR 24F LRG (ELECTROSURGICAL) ×4 IMPLANT
MANIFOLD NEPTUNE II (INSTRUMENTS) ×4 IMPLANT
PACK CYSTO (CUSTOM PROCEDURE TRAY) ×4 IMPLANT
PLUG CATH AND CAP STER (CATHETERS) IMPLANT
SET ASPIRATION TUBING (TUBING) IMPLANT
SYRINGE 35CC LL (MISCELLANEOUS) ×4 IMPLANT
TUBE CONNECTING 12'X1/4 (SUCTIONS) ×1
TUBE CONNECTING 12X1/4 (SUCTIONS) ×3 IMPLANT

## 2016-12-02 NOTE — H&P (Signed)
CC/HPI: Flow Symptoms     Bryce Stewart is a 51 yo male who is sent in consultation by Dr. Felecia Shelling for voiding symptoms. he has a several year history of hesitancy. He has a reduced stream with prolonged voiding. He doesn't feel he empties. He has frequency q2hr and nocturia q2hrs but he works third shift. He has some urgency. He has dysuria with most voids and he has seen blood in the urine a couple of times with the most recent last month. He has had prior UTI's. He has had no GU surgery. He has had no stones. He has a pulling in the groin when he voids and feels like he needs to have a BM when he needs to void. He was hospitalized in march for an infection of unknown origin. He was in [facility] in December for a bad UTI. He had strep ag on his culture. He had GC remotely.    CC: I am having trouble with my erections.  HPI: Bryce Stewart is a 51 year-old male patient who was referred by Dr. Ninetta Lights D. Felecia Shelling, MD who is here for erectile dysfunction.    He has a history of ED that began in December when he went to the hospital. He has been a diabetic for about 8 years. he has had some pain in his feet at times.      ALLERGIES: None   MEDICATIONS: Lisinopril 10 mg tablet  Metformin Hcl 500 mg tablet  Atorvastatin Calcium 20 mg tablet  Novolog     GU PSH: None   NON-GU PSH: Dental Surgery Procedure    GU PMH: Family history of malignant neoplasm of kidney      PMH Notes: hemorrhoidal pain   NON-GU PMH: Diabetes Type 2 Hypercholesterolemia Hypertension    FAMILY HISTORY: Diabetes - Runs in Family father deceased - Father Thyroid Disease - Mother   SOCIAL HISTORY: Marital Status: Married Current Smoking Status: Patient smokes.   Tobacco Use Assessment Completed: Used Tobacco in last 30 days? Has never drank.  Drinks 1 caffeinated drink per day. 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     Notes: 1 son 2 daughters   REVIEW OF SYSTEMS:    GU Review Male:   Patient reports burning/ pain with  urination, get up at night to urinate, erection problems, and penile pain. Patient denies frequent urination, hard to postpone urination, leakage of urine, stream starts and stops, trouble starting your stream, and have to strain to urinate .  Gastrointestinal (Upper):   Patient denies nausea, vomiting, and indigestion/ heartburn.  Gastrointestinal (Lower):   Patient reports constipation. Patient denies diarrhea.  Constitutional:   Patient reports fever, night sweats, weight loss, and fatigue.   Skin:   Patient denies skin rash/ lesion and itching.  Eyes:   Patient denies blurred vision and double vision.  Ears/ Nose/ Throat:   Patient denies sore throat and sinus problems.  Hematologic/Lymphatic:   Patient denies swollen glands and easy bruising.  Cardiovascular:   Patient denies leg swelling and chest pains.  Respiratory:   Patient denies cough and shortness of breath.  Endocrine:   Patient denies excessive thirst.  Musculoskeletal:   Patient reports joint pain. Patient denies back pain.  Neurological:   Patient denies headaches and dizziness.  Psychologic:   Patient denies depression and anxiety.   VITAL SIGNS:      11/27/2016 02:57 PM  Weight 167 lb / 75.75 kg  Height 67.5 in / 171.45 cm  BP 106/72 mmHg  Pulse  93 /min  Temperature 98.8 F / 37 C  BMI 25.8 kg/m   GU PHYSICAL EXAMINATION:    Anus and Perineum: No hemorrhoids. No anal stenosis. No rectal fissure, no anal fissure. No edema, no dimple, no perineal tenderness, no anal tenderness.  Scrotum: No lesions. No edema. No cysts. No warts.  Epididymides: Right: no spermatocele, no masses, no cysts, no tenderness, no induration, no enlargement. Left: no spermatocele, no masses, no cysts, no tenderness, no induration, no enlargement.  Testes: Atrophic left testis. Atrophic right testis. No tenderness, no swelling, no enlargement left testis. No tenderness, no swelling, no enlargement right testis. Normal location left testis. Normal  location right testis. No mass, no cyst, no varicocele, no hydrocele left testis. No mass, no cyst, no varicocele, no hydrocele right testis.   Urethral Meatus: Mild urethral meatal stenosis. No lesion, no wart, no polyp, no balanitis, no discharge. Normal location.   Penis: Circumcised, no warts, no cracks. No dorsal Peyronie's plaques, no left corporal Peyronie's plaques, no right corporal Peyronie's plaques, no scarring, no warts. No balanitis, no meatal stenosis.  Prostate: He has an indistinct prostate with asymmetry right greater than left with a central nodule.   Seminal Vesicles: Nonpalpable.  Sphincter Tone: Normal sphincter. No rectal tenderness. No rectal mass.    MULTI-SYSTEM PHYSICAL EXAMINATION:    Constitutional: Well-nourished. No physical deformities. Normally developed. Good grooming.  Neck: Neck symmetrical, not swollen. Normal tracheal position.  Respiratory: No labored breathing, no use of accessory muscles. CTA  Cardiovascular: Normal temperature, RRR without murmur.   Lymphatic: No enlargement of neck, axillae, groin.  Skin: No paleness, no jaundice, no cyanosis. No lesion, no ulcer, no rash.  Neurologic / Psychiatric: Oriented to time, oriented to place, oriented to person. No depression, no anxiety, no agitation.  Gastrointestinal: No mass, no tenderness, no rigidity, non obese abdomen.  Musculoskeletal: Normal gait and station of head and neck.     PAST DATA REVIEWED:  Source Of History:  Patient   PROCEDURES:          Urinalysis w/Scope - 81001 Dipstick Dipstick Cont'd Micro  Color: Yellow Bilirubin: Neg WBC/hpf: 10 - 20/hpf  Appearance: Cloudy Ketones: Neg RBC/hpf: 0 - 2/hpf  Specific Gravity: 1.025 Blood: Trace Bacteria: Few (10-25/hpf)  pH: 6.0 Protein: Neg Cystals: NS (Not Seen)  Glucose: 3+ Urobilinogen: 0.2 Casts: NS (Not Seen)    Nitrites: Neg Trichomonas: Not Present    Leukocyte Esterase: 2+ Mucous: Not Present      Epithelial Cells: 0 - 5/hpf       Yeast: NS (Not Seen)      Sperm: Present    ASSESSMENT:      ICD-10 Details  1 GU:   Nodular prostate w/ LUTS - N40.3 He has an abnormal prostate exam that is worrisome for neoplasia and has associated obstructive voiding symptoms and penile pain and he has had a 20lb weight loss. PSA today and I will set him up for a prostate biopsy at some point in the near future depending on that result along with the CT and culture.   2   Weak Urinary Stream - R39.12 will get a flowrate and PVR on return.   3   Urinary Hesitancy - R39.11   4   Cystitis, Unspec w/o hematuria - N30.90 His UA looks infected today and a culture will be obtained.   5   Dysuria - R30.0 Urine culture today.   6   Gross hematuria - R31.0 I am  going to get a CT hematuria study and will probably need to do cystoscopy but he has meatal stenosis and will likely need it as an outpatient.   7   Atrophy of testis - N50.0 He has bilateral testicular atrophy so I will get a testosterone level on him.   8   ED, arterial insufficiency - N52.01 He has ED that began in december that could be secondary to his DM but he could be hypogonadol as well.    PLAN:           Orders Labs Urine Culture, PSA with Reflex, Total Testosterone, BUN/Creatinine  X-Ray Notes: . History: GROSS HEMATURIA Hematuria: Yes/  Patient to see MD after exam: /No   Previous exam:/ None   When:   Where:   Diabetic: Yes/  BUN/ Creatinine: PENDING  Date of last BUN Creatinine: 11/27/16  Weight in pounds: 167  Allergy- IV Contrast:/ No   Conflicting diabetic meds: Yes  Diabetic Meds: METFORMIN INSULIN  Prior Authorization #:            Schedule X-Rays: C.T. Hematuria With and Without I.V. Contrast - Next available  Return Visit/Planned Activity: Next Available Appointment - Office Visit, PVR, Flow Rate          Document Letter(s):  Created for Patient: Clinical Summary         Notes:   CC: Dr. Avon Gully   Signed by Bjorn Pippin, M.D. on  11/27/16 at 5:32 PM (EDT)       APPENDED NOTES:  The urine culture is negative but his CT shows a probable large multilocular abscess of the prostate.   I have spoken to [facility] and reviewed the finding and will have him set up for Monday afternoon for cystoscopy with incision of the abscess. I have reviewed the risks of bleeding, infection, urethral and prostate injury, need for secondary procedures, incontinence, impotence, ejaculatory dysfunction, strictures, thrombotic events and anesthetic complications.      Signed by Bjorn Pippin, M.D. on 12/02/16 at 8:03 AM (EDT)

## 2016-12-02 NOTE — Discharge Instructions (Addendum)
Indwelling Urinary Catheter Care, Adult Taking good care of your catheter will keep it working properly and help prevent problems from developing. How to wear your catheter Attach your catheter to your leg with adhesive tape or a leg strap. Make sure there is no tension on the catheter. If you use adhesive tape, first remove any sticky residue from the previous tape you used. How to wear a drainage bag  You should have received a large overnight drainage bag and a smaller leg bag that fits underneath clothing. You may wear the overnight bag at any time, but you should never wear the smaller leg bag at night.  Always keep the overnight drainage bag below the level of your bladder, but keep it off the floor. When you sleep, hang the bag inside a wastebasket that is covered by a clean plastic bag.  Always wear the leg bag below your knee. Keep the leg bag secure with a leg strap or adhesive tape. How to care for your skin  Clean the skin around the catheter at least once every day.  Shower every day. Do not take baths.  Apply creams, lotions, or ointments to your genital area only as told by your health care provider.  Do not use powders, sprays, or lotions on your genital area. How to clean your catheter and your skin 1. Wash your hands with soap and water. 2. Wet a washcloth in warm water and mild soap. 3. Use the washcloth to clean the skin where the catheter enters your body. Clean downward, wiping away from the catheter in small circles. Do not wipe toward the catheter. This can push bacteria into the urethra and cause infection. 4. Pat the area dry with a clean towel. Make sure to remove all soap. How to care for your drainage bags Empty your drainage bag when it is ?- full, or at least 2-3 times a day. Replace your drainage bag once a month or sooner if it starts to smell bad or look dirty. Do not clean your drainage bag unless told by your health care provider. Emptying a drainage  bag   Supplies Needed  Rubbing alcohol.  Gauze pad or cotton ball.  Adhesive tape or a leg strap. Steps 1. Wash your hands with soap and water. 2. Detach the drainage bag from your leg. 3. Hold the drainage bag over the toilet or a clean container. Keep the drainage bag below your hips and bladder. This stops urine from going back into the tubing and into your bladder. 4. Open the pour spout at the bottom of the bag. 5. Empty the urine into the toilet or container. Do not let the pour spout touch any surface. This helps keep bacteria out of the bag and helps prevent infection. 6. Apply rubbing alcohol to a gauze pad or cotton ball. 7. Use the gauze pad or cotton ball to clean the pour spout. 8. Close the pour spout. 9. Attach the bag to your leg with adhesive tape or a leg strap. 10. Wash your hands. Changing a drainage bag  Supplies Needed  Alcohol wipes.  A clean drainage bag.  Adhesive tape or a leg strap. Steps 1. Wash your hands with soap and water. 2. Detach the dirty drainage bag from your leg. 3. Pinch the rubber catheter with your fingers so that urine does not spill out. 4. Disconnect the catheter tube from the drainage tube at the connection valve. Do not let the tubes touch any surface. 5. Clean  the end of the catheter tube with an alcohol wipe. Use a different alcohol wipe to clean the end of the drainage tube. 6. Connect the catheter tube to the drainage tube of the clean drainage bag. 7. Attach the new bag to your leg with adhesive tape or a leg strap. Avoid attaching the new bag too tightly. 8. Wash your hands. How to prevent infection and other problems  Never pull on your catheter or try to remove it. Pulling can damage your internal tissues.  Always wash your hands before and after handling your catheter.  If a leg strap gets wet, replace it with a dry one.  Drink enough fluids to keep your urine clear or pale yellow, or as told by your health care  provider.  Do not let the drainage bag or tubing touch the floor.  Wear cotton underwear. Cotton absorbs moisture and keeps your skin dry.  If you are male, wipe from front to back after each bowel movement.  Check the catheter often to make sure that it works properly and the tubing is not twisted or curled. Contact a health care provider if:  Your urine is cloudy.  Your urine smells unusually bad.  Your urine is not draining into the bag.  Your catheter gets clogged.  Your catheter starts to leak.  Your bladder feels full. Get help right away if:  You have redness, swelling, or pain where the catheter enters your body.  You have fluid, pus, or a bad smell coming from the area where the catheter enters your body.  The area where the catheter enters your body feels warm to the touch.  You have a fever.  You have pain in your abdomen, legs, lower back, or bladder.  You see blood fill the catheter.  Your urine is pink or red.  You have nausea, vomiting, or chills.  Your catheter gets pulled out. This information is not intended to replace advice given to you by your health care provider. Make sure you discuss any questions you have with your health care provider. Document Released: 08/05/2005 Document Revised: 07/03/2016 Document Reviewed: 01/18/2014 Elsevier Interactive Patient Education  2017 [employer] Transurethral Resection of the Prostate, Care After Refer to this sheet in the next few weeks. These instructions provide you with information about caring for yourself after your procedure. Your health care provider may also give you more specific instructions. Your treatment has been planned according to current medical practices, but problems sometimes occur. Call your health care provider if you have any problems or questions after your procedure. What can I expect after the procedure? After the procedure, it is common to have:  Mild pain in your lower  abdomen.  Soreness or mild discomfort in your penis from having the catheter inserted during the procedure.  A feeling of urgency when you need to urinate.  A small amount of blood in your urine. You may notice some small blood clots in your urine. These are normal. Follow these instructions at home: Medicines    Take over-the-counter and prescription medicines only as told by your health care provider.  Do not drive or operate heavy machinery while taking prescription pain medicine.  Do not drive for 24 hours if you received a sedative.  If you were prescribed antibiotic medicine, take it as told by your health care provider. Do not stop taking the antibiotic even if you start to feel better. Activity   Return to your normal activities as told by  your health care provider. Ask your health care provider what activities are safe for you.  Do not lift anything that is heavier than 10 lb (4.5 kg) for 3 weeks after your procedure, or as long as told by your health care provider.  Avoid intense physical activity for as long as told by your health care provider.  Walk at least one time every day. This helps to prevent blood clots. You may increase your physical activity gradually as you start to feel better. Lifestyle   Do not drink alcohol for as long as told by your health care provider. This is especially important if you are taking prescription pain medicines.  Do not engage in sexual activity until your health care provider says that you can do this. General instructions   Do not take baths, swim, or use a hot tub until your health care provider approves.  Drink enough fluid to keep your urine clear or pale yellow.  Urinate as soon as you feel the need to. Do not try to hold your urine for long periods of time.  If your health care provider approves, you may take a stool softener for 2-3 weeks to prevent you from straining to have a bowel movement.  Wear compression stockings  as told by your health care provider. These stockings help to prevent blood clots and reduce swelling in your legs.  Keep all follow-up visits as told by your health care provider. This is important. Contact a health care provider if:  You have difficulty urinating.  You have a fever.  You have pain that gets worse or does not improve with medicine.  You have blood in your urine that does not go away after 1 week of resting and drinking more fluids.  You have swelling in your penis or testicles. Get help right away if:  You are unable to urinate.  You are having more blood clots in your urine instead of fewer.  You have:  Large blood clots.  A lot of blood in your urine.  Pain in your back or lower abdomen.  Pain or swelling in your legs.  Chills and you are shaking. This information is not intended to replace advice given to you by your health care provider. Make sure you discuss any questions you have with your health care provider. Document Released: 08/05/2005 Document Revised: 04/07/2016 Document Reviewed: 04/27/2015 Elsevier Interactive Patient Education  2017 [employer]  Post Anesthesia Home Care Instructions  Activity: Get plenty of rest for the remainder of the day. A responsible individual must stay with you for 24 hours following the procedure.  For the next 24 hours, DO NOT: -Drive a car -Advertising copywriter -Drink alcoholic beverages -Take any medication unless instructed by your physician -Make any legal decisions or sign important papers.  Meals: Start with liquid foods such as gelatin or soup. Progress to regular foods as tolerated. Avoid greasy, spicy, heavy foods. If nausea and/or vomiting occur, drink only clear liquids until the nausea and/or vomiting subsides. Call your physician if vomiting continues.  Special Instructions/Symptoms: Your throat may feel dry or sore from the anesthesia or the breathing tube placed in your throat during surgery.  If this causes discomfort, gargle with warm salt water. The discomfort should disappear within 24 hours.  If you had a scopolamine patch placed behind your ear for the management of post- operative nausea and/or vomiting:  1. The medication in the patch is effective for 72 hours, after which it should be  removed.  Wrap patch in a tissue and discard in the trash. Wash hands thoroughly with soap and water. 2. You may remove the patch earlier than 72 hours if you experience unpleasant side effects which may include dry mouth, dizziness or visual disturbances. 3. Avoid touching the patch. Wash your hands with soap and water after contact with the patch.

## 2016-12-02 NOTE — Anesthesia Preprocedure Evaluation (Addendum)
Anesthesia Evaluation  Patient identified by MRN, date of birth, ID band Patient awake    Reviewed: Allergy & Precautions, NPO status , Patient's Chart, lab work & pertinent test results  Airway Mallampati: II  TM Distance: >3 FB Neck ROM: Full    Dental  (+) Teeth Intact, Dental Advisory Given, Missing   Pulmonary Current Smoker,    Pulmonary exam normal breath sounds clear to auscultation       Cardiovascular Exercise Tolerance: Good Normal cardiovascular exam Rhythm:Regular Rate:Normal  HLD   Neuro/Psych negative neurological ROS  negative psych ROS   GI/Hepatic negative GI ROS, Neg liver ROS,   Endo/Other  diabetes, Type 2, Oral Hypoglycemic Agents, Insulin Dependent  Renal/GU negative Renal ROS     Musculoskeletal negative musculoskeletal ROS (+)   Abdominal   Peds  Hematology negative hematology ROS (+)   Anesthesia Other Findings Day of surgery medications reviewed with the patient.  Reproductive/Obstetrics                            Anesthesia Physical Anesthesia Plan  ASA: II  Anesthesia Plan: General   Post-op Pain Management:    Induction: Intravenous  Airway Management Planned: LMA  Additional Equipment:   Intra-op Plan:   Post-operative Plan: Extubation in OR  Informed Consent: I have reviewed the patients History and Physical, chart, labs and discussed the procedure including the risks, benefits and alternatives for the proposed anesthesia with the patient or authorized representative who has indicated his/her understanding and acceptance.   Dental advisory given  Plan Discussed with: CRNA  Anesthesia Plan Comments: (Risks/benefits of general anesthesia discussed with patient including risk of damage to teeth, lips, gum, and tongue, nausea/vomiting, allergic reactions to medications, and the possibility of heart attack, stroke and death.  All patient questions  answered.  Patient wishes to proceed.)        Anesthesia Quick Evaluation

## 2016-12-02 NOTE — Anesthesia Procedure Notes (Signed)
Procedure Name: LMA Insertion Date/Time: 12/02/2016 12:17 PM Performed by: Cecile Hearing Pre-anesthesia Checklist: Patient identified, Emergency Drugs available, Suction available and Patient being monitored Patient Re-evaluated:Patient Re-evaluated prior to inductionOxygen Delivery Method: Circle system utilized Preoxygenation: Pre-oxygenation with 100% oxygen Intubation Type: IV induction Ventilation: Mask ventilation without difficulty LMA: LMA inserted LMA Size: 4.0 Number of attempts: 1 Airway Equipment and Method: Bite block Placement Confirmation: positive ETCO2 Tube secured with: Tape Dental Injury: Teeth and Oropharynx as per pre-operative assessment

## 2016-12-02 NOTE — Transfer of Care (Signed)
Last Vitals:  Vitals:   12/02/16 1000  BP: 139/84  Pulse: 92  Resp: 18  Temp: 36.9 C    Last Pain:  Vitals:   12/02/16 1000  TempSrc: Oral      Patients Stated Pain Goal: 4 (12/02/16 1034)  Immediate Anesthesia Transfer of Care Note  Patient: Bryce Stewart  Procedure(s) Performed: Procedure(s) (LRB): CYSTOSCOPY WITH INCISION OF PROSTATIC ABSCESS (N/A) TRANSURETHRAL RESECTION OF THE PROSTATE (TURP) CYSTOSCOPY WITH URETHRAL DILATATION  Patient Location: PACU  Anesthesia Type: General  Level of Consciousness: drowsy, oriented and follows commands  Airway & Oxygen Therapy: Patient Spontanous Breathing and Patient connected to nasal cannula oxygen  Post-op Assessment: Report given to PACU RN and Post -op Vital signs reviewed and stable  Post vital signs: Reviewed and stable  Complications: No apparent anesthesia complications

## 2016-12-02 NOTE — Brief Op Note (Signed)
12/02/2016  1:21 PM  PATIENT:  Meridee Score  51 y.o. male  PRE-OPERATIVE DIAGNOSIS:  PROSTATIC ABSCESS  POST-OPERATIVE DIAGNOSIS:  PROSTATIC ABSCESS, BULBAR URETHRAL STRICTURE  PROCEDURE:  Procedure(s): CYSTOSCOPY WITH INCISION OF PROSTATIC ABSCESS (N/A) TRANSURETHRAL RESECTION OF THE PROSTATE (TURP) CYSTOSCOPY WITH URETHRAL DILATATION  SURGEON:  Surgeon(s) and Role:    * Bjorn Pippin, MD - Primary  PHYSICIAN ASSISTANT:   ASSISTANTS: none   ANESTHESIA:   general  EBL:  Total I/O In: 1000 [I.V.:1000] Out: 10 [Blood:10]  BLOOD ADMINISTERED:none  DRAINS: Urinary Catheter (Foley)   LOCAL MEDICATIONS USED:  NONE  SPECIMEN:  Source of Specimen:  urine  DISPOSITION OF SPECIMEN:  sent for culture.   COUNTS:  YES  TOURNIQUET:  * No tourniquets in log *  DICTATION: .Other Dictation: Dictation Number [id number]  PLAN OF CARE: Discharge to home after PACU  PATIENT DISPOSITION:  PACU - hemodynamically stable.   Delay start of Pharmacological VTE agent (>24hrs) due to surgical blood loss or risk of bleeding: yes

## 2016-12-02 NOTE — Anesthesia Postprocedure Evaluation (Deleted)
Anesthesia Post Note  Patient: Bryce Stewart  Procedure(s) Performed: Procedure(s) (LRB): CYSTOSCOPY WITH INCISION OF PROSTATIC ABSCESS (N/A) TRANSURETHRAL RESECTION OF THE PROSTATE (TURP) CYSTOSCOPY WITH URETHRAL DILATATION  Patient location during evaluation: PACU Anesthesia Type: General Level of consciousness: awake and alert Pain management: pain level controlled Vital Signs Assessment: post-procedure vital signs reviewed and stable Respiratory status: spontaneous breathing, nonlabored ventilation, respiratory function stable and patient connected to nasal cannula oxygen Cardiovascular status: blood pressure returned to baseline and stable Postop Assessment: no signs of nausea or vomiting Anesthetic complications: no       Last Vitals:  Vitals:   12/02/16 1337 12/02/16 1340  BP:  (!) 172/103  Pulse:  75  Resp: (P) 14 20  Temp: (P) 36.6 C     Last Pain:  Vitals:   12/02/16 1000  TempSrc: Oral                 Cecile Hearing

## 2016-12-03 ENCOUNTER — Encounter (HOSPITAL_BASED_OUTPATIENT_CLINIC_OR_DEPARTMENT_OTHER): Payer: Self-pay | Admitting: Urology

## 2016-12-03 LAB — URINE CULTURE: CULTURE: NO GROWTH

## 2016-12-03 NOTE — Op Note (Signed)
NAME:  Bryce Stewart, Bryce Stewart                        ACCOUNT NO.:  MEDICAL RECORD NO.:  [id number]  LOCATION:                                 FACILITY:  PHYSICIAN:  Excell Seltzer. Annabell Howells, M.D.    DATE OF BIRTH:  Feb 03, 1966  DATE OF PROCEDURE:  12/02/2016 DATE OF DISCHARGE:                              OPERATIVE REPORT   PROCEDURES:  Cystoscopy with urethral dilation and transurethral resection of the prostate with incision and drainage of prostatic abscess.  PREOPERATIVE DIAGNOSIS:  Prostatic abscess.  POSTOPERATIVE DIAGNOSIS:  Prostatic abscess with severe bulbar urethral stricture.  SURGEON:  Excell Seltzer. Annabell Howells, M.D.  ANESTHESIA:  General.  SPECIMEN:  Urine culture.  DRAINS:  A 22-French Foley catheter.  BLOOD LOSS:  Minimal.  COMPLICATIONS:  None.  INDICATIONS:  Mr. Rudnick is a 51 year old African American male who I saw last week for a history of recurrent urinary tract infections and voiding difficulty, who had been admitted for sepsis in December, no imaging was done.  He had another urinary tract infection, treated in January.  He had a CT scan as part of his evaluation after a prostate exam revealed a severely abnormal prostate that I felt was initially suspicious for locally advanced neoplasm, but he was found on CT to have what appeared to be a multilocular prostatic abscess.  His PSA was only 0.18.  It was felt that incision and drainage of the abscess was indicated.  FINDINGS AND PROCEDURE:  He was given Ancef and gentamycin.  He was taken to the operating room where general anesthetic was induced.  He was placed in lithotomy position, was fitted with PAS hose.  His perineum and genitalia were prepped with Betadine solution and he was draped in usual sterile fashion.  Initially, cystoscope was attempted with a 23-French scope, but his meatus was tight, so he was dilated with Sissy Hoff sounds from 18 to 37- [place].  I did not dilate beyond the penile urethra for fear of  more proximal strictures.  The cystoscope was then inserted.  There was some mild stricturing of the mid-to-distal bulbar urethra, but in the proximal bulb, there was a very tight stricture that would not admit the scope and I could not see beyond the point of stricture.  At this point, the cystoscope was removed and a 6.5-French semi-rigid ureteroscope was passed.  Using the ureteroscope, I was able to get closer to the urethral lumen and advanced the Sensor guidewire, which went easily into the bladder.  I then dilated the urethra to 30-French with [facility] dilators.  Once the urethra was dilated, the cystoscope was reinserted.  This demonstrated disruption of the stricture.  There were some inflammatory changes proximal to the area of stricture involving the membranous urethra, consistent with keratinization of the mucosa.  The prostatic urethra was short with minimal hyperplasia.  The bladder neck was somewhat high.  Inspection of the bladder revealed cloudy urine with erythema of the bladder wall.  The ureteral orifices were unremarkable.  No tumors or stones were seen.  At this point, a 26-French continuous flow resectoscope sheath was inserted.  This was fitted with  an [place] handle and initially, a small bipolar loop saline was used as the irrigant.  It was felt that the abscess cavity was very superficial and that the loop should be sufficient.  However, I began resecting at the bladder neck from 5 to 7 o'clock and out to just proximal verumontanum and resected into the prostate floor approximately 1 to 1.5 cm and did not encounter the abscess.  Some additional lateral resection was performed because the abscess did appear to extend laterally.  At this point, the bladder was evacuated free of chips and I felt a Collins knife would provide a safer deeper incision, so the [employer] was placed.  An incision was made at 7 o'clock from the bladder neck to the  verumontanum.  After I resected down approximately 1 more centimeter into the prostatic tissue, I encountered flow of purulent material.  The abscess cavity was then opened more widely and the thick purulent material was flushed out.  The lining of the cavity had inflamed appearing mucosa.  Once all the purulent material had been evacuated, the resectoscope was inserted back into the bladder and a guidewire was then passed into the bladder and the bladder was inspected, no retained chips or active bleeding were noted in the bladder or the prostatic urethra.  With resectoscope in the bladder, the guidewire was reinserted well into the bladder and the resectoscope was removed.  A 22-French Foley catheter was then passed over the wire after using a catheter punch converted to a [place]cill catheter.  The balloon was then filled with 30 mL of sterile fluid and the wire was removed.  The catheter over the wire technique was used as the initial attempt, using a catheter guide was unsuccessful.  Once the catheter was in good position, the balloon was inflated and the wire was removed.  The bladder was irrigated with clear return.  We did obtain a specimen of the purulent urine after the abscess cavity had been unroofed and sent that for culture.  At this point, the catheter was placed to straight drainage and the patient was taken down from lithotomy position.  His anesthetic was reversed.  He was moved to the recovery room in stable condition.  There were no complications.    Excell Seltzer. Annabell Howells, M.D.    JJW/MEDQ  D:  12/02/2016  T:  12/03/2016  Job:  [id number]

## 2016-12-03 NOTE — Anesthesia Postprocedure Evaluation (Signed)
Anesthesia Post Note  Patient: Bryce Stewart  Procedure(s) Performed: Procedure(s) (LRB): CYSTOSCOPY WITH INCISION OF PROSTATIC ABSCESS (N/A) TRANSURETHRAL RESECTION OF THE PROSTATE (TURP) CYSTOSCOPY WITH URETHRAL DILATATION  Patient location during evaluation: PACU Anesthesia Type: General Level of consciousness: awake and alert Pain management: pain level controlled Vital Signs Assessment: post-procedure vital signs reviewed and stable Respiratory status: spontaneous breathing, nonlabored ventilation, respiratory function stable and patient connected to nasal cannula oxygen Cardiovascular status: blood pressure returned to baseline and stable Postop Assessment: no signs of nausea or vomiting Anesthetic complications: no        Last Vitals:  Vitals:   12/02/16 1430 12/02/16 1532  BP: 140/90 (!) 151/79  Pulse: 72   Resp: 19 16  Temp:  36.9 C    Last Pain:  Vitals:   12/02/16 1532  TempSrc:   PainSc: 4    Pain Goal: Patients Stated Pain Goal: 4 (12/02/16 1034)               Cecile Hearing

## 2016-12-31 ENCOUNTER — Ambulatory Visit (INDEPENDENT_AMBULATORY_CARE_PROVIDER_SITE_OTHER): Payer: Self-pay | Admitting: "Endocrinology

## 2016-12-31 ENCOUNTER — Encounter: Payer: Self-pay | Admitting: "Endocrinology

## 2016-12-31 VITALS — BP 119/80 | HR 90 | Ht 67.0 in | Wt 169.0 lb

## 2016-12-31 DIAGNOSIS — E1165 Type 2 diabetes mellitus with hyperglycemia: Secondary | ICD-10-CM

## 2016-12-31 DIAGNOSIS — Z794 Long term (current) use of insulin: Secondary | ICD-10-CM | POA: Diagnosis not present

## 2016-12-31 DIAGNOSIS — E7800 Pure hypercholesterolemia, unspecified: Secondary | ICD-10-CM

## 2016-12-31 DIAGNOSIS — E118 Type 2 diabetes mellitus with unspecified complications: Secondary | ICD-10-CM | POA: Diagnosis not present

## 2016-12-31 DIAGNOSIS — IMO0002 Reserved for concepts with insufficient information to code with codable children: Secondary | ICD-10-CM | POA: Insufficient documentation

## 2016-12-31 NOTE — Progress Notes (Signed)
Subjective:    Patient ID: Bryce ScoreMark Stewart, male    DOB: 07/07/1966. Patient is being seen in consultation for management of diabetes requested by  Avon GullyFanta, Tesfaye, MD  Past Medical History:  Diagnosis Date  . Diabetes mellitus without complication (HCC)   . High cholesterol    Past Surgical History:  Procedure Laterality Date  . CYSTOSCOPY N/A 12/02/2016   Procedure: CYSTOSCOPY WITH INCISION OF PROSTATIC ABSCESS;  Surgeon: Bjorn PippinJohn Wrenn, MD;  [facility] [place];  Service: Urology;  Laterality: N/A;  . CYSTOSCOPY WITH URETHRAL DILATATION  12/02/2016   Procedure: CYSTOSCOPY WITH URETHRAL DILATATION;  Surgeon: Bjorn PippinJohn Wrenn, MD;  [facility] [place];  Service: Urology;;  . TRANSURETHRAL RESECTION OF PROSTATE  12/02/2016   Procedure: TRANSURETHRAL RESECTION OF THE PROSTATE (TURP);  Surgeon: Bjorn PippinJohn Wrenn, MD;  [facility] [place];  Service: Urology;;   Social History   Social History  . Marital status: Married    Spouse name: N/A  . Number of children: N/A  . Years of education: N/A   Social History Main Topics  . Smoking status: Current Every Day Smoker    Packs/day: 0.50  . Smokeless tobacco: Never Used  . Alcohol use No  . Drug use: No  . Sexual activity: Not Asked   Other Topics Concern  . None   Social History Narrative  . None   Outpatient Encounter Prescriptions as of 12/31/2016  Medication Sig  . atorvastatin (LIPITOR) 20 MG tablet Take 20 mg by mouth daily.  . insulin NPH-regular Human (NOVOLIN 70/30) (70-30) 100 UNIT/ML injection Inject 30 Units into the skin 2 (two) times daily with a meal.  . metFORMIN (GLUCOPHAGE) 500 MG tablet Take 1 tablet (500 mg total) by mouth 2 (two) times daily with a meal.  . [DISCONTINUED] atorvastatin (LIPITOR) 20 MG tablet Take 1 tablet (20 mg total) by mouth daily at 6 PM.  . [DISCONTINUED] cephALEXin (KEFLEX) 500 MG capsule Take 1 capsule (500 mg total) by mouth 4 (four) times daily.  .  [DISCONTINUED] HYDROcodone-acetaminophen (NORCO) 5-325 MG tablet Take 1 tablet by mouth every 6 (six) hours as needed for moderate pain.  . [DISCONTINUED] Insulin Glargine (LANTUS) 100 UNIT/ML Solostar Pen Inject 30 Units into the skin daily at 10 pm.   No facility-administered encounter medications on file as of 12/31/2016.    ALLERGIES: No Known Allergies VACCINATION STATUS:  There is no immunization history on file for this patient.  Diabetes  He presents for his initial diabetic visit. He has type 2 diabetes mellitus. Onset time: He was diagnosed at approximate age of 42 years. His disease course has been worsening. There are no hypoglycemic associated symptoms. Pertinent negatives for hypoglycemia include no confusion, headaches, pallor or seizures. Associated symptoms include foot paresthesias, polydipsia and polyuria. Pertinent negatives for diabetes include no chest pain, no fatigue, no polyphagia and no weakness. There are no hypoglycemic complications. Symptoms are worsening. There are no diabetic complications. Risk factors for coronary artery disease include diabetes mellitus, dyslipidemia, male sex and tobacco exposure. Current diabetic treatment includes insulin injections and oral agent (monotherapy). His weight is stable (She gives history of maximum body weight at 215 pounds during the time of his diagnosis with diabetes.). He is following a generally unhealthy diet. When asked about meal planning, he reported none. He has not had a previous visit with a dietitian. He never participates in exercise. (He did not bring any meter nor logs to review today. He admits  he does not monitor blood glucose regularly.) An ACE inhibitor/angiotensin II receptor blocker is not being taken. He does not see a podiatrist.Eye exam is current.  Hyperlipidemia  This is a chronic problem. The current episode started more than 1 year ago. Exacerbating diseases include diabetes. Pertinent negatives include no  chest pain, myalgias or shortness of breath. Current antihyperlipidemic treatment includes statins. Risk factors for coronary artery disease include diabetes mellitus, dyslipidemia, male sex and a sedentary lifestyle.       Review of Systems  Constitutional: Negative for chills, fatigue, fever and unexpected weight change.  HENT: Negative for dental problem, mouth sores and trouble swallowing.   Eyes: Negative for visual disturbance.  Respiratory: Negative for cough, choking, chest tightness, shortness of breath and wheezing.   Cardiovascular: Negative for chest pain, palpitations and leg swelling.  Gastrointestinal: Negative for abdominal distention, abdominal pain, constipation, diarrhea, nausea and vomiting.  Endocrine: Positive for polydipsia and polyuria. Negative for polyphagia.  Genitourinary: Negative for dysuria, flank pain, hematuria and urgency.  Musculoskeletal: Negative for back pain, gait problem, myalgias and neck pain.  Skin: Negative for pallor, rash and wound.  Neurological: Negative for seizures, syncope, weakness, numbness and headaches.  Psychiatric/Behavioral: Negative.  Negative for confusion and dysphoric mood.    Objective:    BP 119/80   Pulse 90   Ht 5\' 7"  (1.702 m)   Wt 169 lb (76.7 kg)   BMI 26.47 kg/m   Wt Readings from Last 3 Encounters:  12/31/16 169 lb (76.7 kg)  12/02/16 166 lb 5 oz (75.4 kg)  10/27/16 182 lb (82.6 kg)    Physical Exam  Constitutional: He is oriented to person, place, and time. He appears well-developed. He is cooperative. No distress.  HENT:  Head: Normocephalic and atraumatic.  Eyes: EOM are normal.  Neck: Normal range of motion. Neck supple. No tracheal deviation present. No thyromegaly present.  Cardiovascular: Normal rate, S1 normal, S2 normal and normal heart sounds.  Exam reveals no gallop.   No murmur heard. Pulses:      Dorsalis pedis pulses are 1+ on the right side, and 1+ on the left side.       Posterior tibial  pulses are 1+ on the right side, and 1+ on the left side.  Pulmonary/Chest: Breath sounds normal. No respiratory distress. He has no wheezes.  Abdominal: Soft. Bowel sounds are normal. He exhibits no distension. There is no tenderness. There is no guarding and no CVA tenderness.  Musculoskeletal: He exhibits no edema.       Right shoulder: He exhibits no swelling and no deformity.  Neurological: He is alert and oriented to person, place, and time. He has normal strength and normal reflexes. No cranial nerve deficit or sensory deficit. Gait normal.  Skin: Skin is warm and dry. No rash noted. No cyanosis. Nails show no clubbing.  Psychiatric: He has a normal mood and affect. His speech is normal. Cognition and memory are normal.    CMP     Component Value Date/Time   NA 137 12/02/2016 1024   K 4.5 12/02/2016 1024   CL 100 (L) 12/02/2016 1024   CO2 25 10/28/2016 0446   GLUCOSE 175 (H) 12/02/2016 1024   BUN 20 12/02/2016 1024   CREATININE 0.90 12/02/2016 1024   CALCIUM 8.1 (L) 10/28/2016 0446   PROT 7.6 10/27/2016 0049   ALBUMIN 3.6 10/27/2016 0049   AST 24 10/27/2016 0049   ALT 23 10/27/2016 0049   ALKPHOS 100 10/27/2016 0049  BILITOT 0.7 10/27/2016 0049   GFRNONAA >60 10/28/2016 0446   GFRAA >60 10/28/2016 0446     Diabetic Labs (most recent): Lab Results  Component Value Date   HGBA1C 11.0 (H) 10/27/2016   HGBA1C 13.0 (H) 07/26/2016   HGBA1C 13.4 (H) 07/25/2016     Lipid Panel ( most recent) Lipid Panel     Component Value Date/Time   CHOL 169 07/26/2016 0545   TRIG 124 07/26/2016 0545   HDL 26 (L) 07/26/2016 0545   CHOLHDL 6.5 07/26/2016 0545   VLDL 25 07/26/2016 0545   LDLCALC 118 (H) 07/26/2016 0545      Assessment & Plan:   1. Uncontrolled type 2 diabetes mellitus with complication, with long-term current use of insulin (HCC) - Patient has currently uncontrolled symptomatic type 2 DM since  51 years of age,  with most recent A1c of 11 %.  His prior 2 A1c  records where 13% and 13.4%.  Recent labs reviewed, showing normal renal function.   His diabetes is complicated by history of heavy alcohol use, and patient remains at a high risk for more acute and chronic complications of diabetes which include CAD, CVA, CKD, retinopathy, and neuropathy. These are all discussed in detail with the patient.  - I have counseled the patient on diet management  by adopting a carbohydrate restricted/protein rich diet.  - Suggestion is made for patient to avoid simple carbohydrates   from their diet including Cakes , Desserts, Ice Cream,  Soda (  diet and regular) , Sweet Tea , Candies,  Chips, Cookies, Artificial Sweeteners,   and "Sugar-free" Products . This will help patient to have stable blood glucose profile and potentially avoid unintended weight gain.  - I encouraged the patient to switch to  unprocessed or minimally processed complex starch and increased protein intake (animal or plant source), fruits, and vegetables.  - Patient is advised to stick to a routine mealtimes to eat 3 meals  a day and avoid unnecessary snacks ( to snack only to correct hypoglycemia).  - The patient will be scheduled with Norm Salt, RDN, CDE for individualized DM education.  - I have approached patient with the following individualized plan to manage diabetes and patient agrees:   -  Due to cost reasons, he is on Novolin 70/30 and 1 other kind of insulin he did not bring.  - Due to his current glycemic burden, he may need basal/bolus insulin therapy, however, his work schedule is not suitable for him to follow this regimen.  - In the meantime, I approached him to monitor blood glucose strictly 4 times a day-before meals and at bedtime, continue Novolin 70/30 30 units with breakfast and 30 units with supper when pre-meal blood glucose is above 90 mg/dL.  - He will return in one week with his meter and logs as well as all of his other medications for reconciliation.   -  Patient is warned not to take insulin without proper monitoring per orders. -Adjustment parameters are given for hypo and hyperglycemia in writing. -Patient is encouraged to call clinic for blood glucose levels less than 70 or above 300 mg /dl. - I will continue metformin 500 mg by mouth twice a day, therapeutically suitable for patient.  - Patient is not a candidate for incretin therapy nor for SGLT2 I due to his light weight.  - He is may be a component of pancreatic diabetes is related to his history of heavy alcohol use.  - Patient specific  target  A1c;  LDL, HDL, Triglycerides, and  Waist Circumference were discussed in detail.  2) BP/HTN: controlled. He is not on any medications. He is advised to bring his medications to review.  3) Lipids/HPL:    Uncontrolled, LDL 118 on 07/26/2016.   Patient is advised to continue Lipitor 20 mg by mouth daily at bedtime.  4)  Weight/Diet: his current weight is appropriate for his height,  CDE Consult will be initiated , exercise, and detailed carbohydrates information provided.  5) Chronic Care/Health Maintenance:  -Patient is on Statin medications and encouraged to continue to follow up with Ophthalmology, Podiatrist at least yearly or according to recommendations, and advised to  Quit smoking. I have recommended yearly flu vaccine and pneumonia vaccination at least every 5 years; moderate intensity exercise for up to 150 minutes weekly; and  sleep for at least 7 hours a day.  - 60 minutes of time was spent on the care of this patient , 50% of which was applied for counseling on diabetes complications and their preventions.  - Patient to bring meter and  blood glucose logs during his next visit.   - I advised patient to maintain close follow up with Avon Gully, MD for primary care needs.  Follow up plan: - Return in about 1 week (around 01/07/2017) for follow up with meter and logs- no labs.  Marquis Lunch, MD Phone: [phone]  Fax:  [phone]   12/31/2016, 4:34 PM

## 2017-01-07 ENCOUNTER — Encounter: Payer: Self-pay | Admitting: "Endocrinology

## 2017-01-07 ENCOUNTER — Ambulatory Visit: Payer: No Typology Code available for payment source | Admitting: "Endocrinology

## 2017-05-05 ENCOUNTER — Encounter (HOSPITAL_COMMUNITY): Payer: Self-pay | Admitting: Emergency Medicine

## 2017-05-05 ENCOUNTER — Emergency Department (HOSPITAL_COMMUNITY)
Admission: EM | Admit: 2017-05-05 | Discharge: 2017-05-05 | Disposition: A | Discharge status: Home / Self Care | Payer: No Typology Code available for payment source | Attending: Emergency Medicine | Admitting: Emergency Medicine

## 2017-05-05 DIAGNOSIS — K0889 Other specified disorders of teeth and supporting structures: Secondary | ICD-10-CM | POA: Diagnosis not present

## 2017-05-05 DIAGNOSIS — F17210 Nicotine dependence, cigarettes, uncomplicated: Secondary | ICD-10-CM | POA: Diagnosis not present

## 2017-05-05 DIAGNOSIS — E119 Type 2 diabetes mellitus without complications: Secondary | ICD-10-CM | POA: Insufficient documentation

## 2017-05-05 DIAGNOSIS — Z794 Long term (current) use of insulin: Secondary | ICD-10-CM | POA: Insufficient documentation

## 2017-05-05 DIAGNOSIS — Z79899 Other long term (current) drug therapy: Secondary | ICD-10-CM | POA: Diagnosis not present

## 2017-05-05 DIAGNOSIS — J019 Acute sinusitis, unspecified: Secondary | ICD-10-CM | POA: Insufficient documentation

## 2017-05-05 DIAGNOSIS — J329 Chronic sinusitis, unspecified: Secondary | ICD-10-CM

## 2017-05-05 MED ORDER — CLINDAMYCIN HCL 150 MG PO CAPS
300.0000 mg | ORAL_CAPSULE | Freq: Once | ORAL | Status: AC
  Administered 2017-05-05: 300 mg via ORAL
  Filled 2017-05-05: qty 2

## 2017-05-05 MED ORDER — CLINDAMYCIN HCL 150 MG PO CAPS: 150.0000 mg | ORAL_CAPSULE | Freq: Four times a day (QID) | ORAL | 0 refills | Status: DC

## 2017-05-05 MED ORDER — IBUPROFEN 800 MG PO TABS
800.0000 mg | ORAL_TABLET | Freq: Once | ORAL | Status: AC
  Administered 2017-05-05: 800 mg via ORAL
  Filled 2017-05-05: qty 1

## 2017-05-05 MED ORDER — IBUPROFEN 600 MG PO TABS: 600.0000 mg | ORAL_TABLET | Freq: Four times a day (QID) | ORAL | 0 refills | Status: DC

## 2017-05-05 MED ORDER — ONDANSETRON HCL 4 MG PO TABS
4.0000 mg | ORAL_TABLET | Freq: Once | ORAL | Status: AC
Start: 2017-05-05 — End: 2017-05-05
  Administered 2017-05-05: 4 mg via ORAL
  Filled 2017-05-05: qty 1

## 2017-05-05 MED ORDER — LORATADINE-PSEUDOEPHEDRINE ER 5-120 MG PO TB12: 1.0000 | ORAL_TABLET | Freq: Two times a day (BID) | ORAL | 0 refills | Status: AC

## 2017-05-05 NOTE — ED Provider Notes (Signed)
[facility]-EMERGENCY DEPT Provider Note   CSN: [id number] Arrival date & time: 05/05/17  1902     History   Chief Complaint Chief Complaint  Patient presents with  . Dental Pain    HPI Bryce Stewart is a 51 y.o. male.  The history is provided by the patient.  Dental Pain   This is a recurrent problem. The current episode started more than 2 days ago. The problem occurs daily. The problem has been gradually worsening. The pain is moderate. He has tried nothing for the symptoms. The treatment provided no relief.    Past Medical History:  Diagnosis Date  . Diabetes mellitus without complication (HCC)   . High cholesterol     Patient Active Problem List   Diagnosis Date Noted  . Uncontrolled type 2 diabetes mellitus with complication, with long-term current use of insulin (HCC) 12/31/2016  . Sepsis (HCC) 10/27/2016  . Diabetes mellitus out of control (HCC) 07/25/2016  . Pyelonephritis 07/25/2016  . Tobacco abuse 07/25/2016  . Hypercholesteremia 07/25/2016    Past Surgical History:  Procedure Laterality Date  . CYSTOSCOPY N/A 12/02/2016   Procedure: CYSTOSCOPY WITH INCISION OF PROSTATIC ABSCESS;  Surgeon: Bjorn Pippin, MD;  Location: [facility];  Service: Urology;  Laterality: N/A;  . CYSTOSCOPY WITH URETHRAL DILATATION  12/02/2016   Procedure: CYSTOSCOPY WITH URETHRAL DILATATION;  Surgeon: Bjorn Pippin, MD;  Location: [facility];  Service: Urology;;  . TRANSURETHRAL RESECTION OF PROSTATE  12/02/2016   Procedure: TRANSURETHRAL RESECTION OF THE PROSTATE (TURP);  Surgeon: Bjorn Pippin, MD;  Location: [facility];  Service: Urology;;    OB History    No data available       Home Medications    Prior to Admission medications   Medication Sig Start Date End Date Taking? Authorizing Provider  atorvastatin (LIPITOR) 20 MG tablet Take 20 mg by mouth daily.    [provider]  insulin NPH-regular Human (NOVOLIN 70/30) (70-30) 100  UNIT/ML injection Inject 30 Units into the skin 2 (two) times daily with a meal.    [provider]  metFORMIN (GLUCOPHAGE) 500 MG tablet Take 1 tablet (500 mg total) by mouth 2 (two) times daily with a meal. 07/27/16   Kari Baars, MD    Family History Family History  Problem Relation Age of Onset  . Thyroid disease Mother   . Diabetes Maternal Aunt   . Diabetes Maternal Uncle   . Diabetes Paternal Aunt   . Diabetes Paternal Uncle   . Diabetes Maternal Grandmother     Social History Social History  Substance Use Topics  . Smoking status: Current Every Day Smoker    Packs/day: 0.50  . Smokeless tobacco: Never Used  . Alcohol use No     Allergies   Patient has no known allergies.   Review of Systems Review of Systems  Constitutional: Negative for activity change.       All ROS Neg except as noted in HPI  HENT: Positive for congestion and dental problem. Negative for nosebleeds.   Eyes: Negative for photophobia and discharge.  Respiratory: Negative for cough, shortness of breath and wheezing.   Cardiovascular: Negative for chest pain and palpitations.  Gastrointestinal: Negative for abdominal pain and blood in stool.  Genitourinary: Negative for dysuria, frequency and hematuria.  Musculoskeletal: Negative for arthralgias, back pain and neck pain.  Skin: Negative.   Neurological: Negative for dizziness, seizures and speech difficulty.  Psychiatric/Behavioral: Negative for confusion and hallucinations.  Physical Exam Updated Vital Signs BP (!) 143/80   Pulse 95   Temp 98.7 F (37.1 C)   Resp 18   Ht  (1.702 m)   Wt 79.4 kg (175 lb)   SpO2 97%   BMI 27.41 kg/m   Physical Exam  Constitutional: He is oriented to person, place, and time. He appears well-developed and well-nourished.  Non-toxic appearance.  HENT:  Head: Normocephalic.  Right Ear: Tympanic membrane and external ear normal.  Left Ear: Tympanic membrane and external ear normal.    Patient has several teeth have been extracted. The left upper premolar has a cavity. There is swelling of the gum is upper and lower. Airway is patent. There is no swelling under the tongue.  Nasal congestion present.  Eyes: Pupils are equal, round, and reactive to light. EOM and lids are normal.  Neck: Normal range of motion. Neck supple. Carotid bruit is not present.  Cardiovascular: Normal rate, regular rhythm, normal heart sounds, intact distal pulses and normal pulses.   Pulmonary/Chest: Breath sounds normal. No respiratory distress.  Abdominal: Soft. Bowel sounds are normal. There is no tenderness. There is no guarding.  Musculoskeletal: Normal range of motion.  Lymphadenopathy:       Head (right side): No submandibular adenopathy present.       Head (left side): No submandibular adenopathy present.    He has no cervical adenopathy.  Neurological: He is alert and oriented to person, place, and time. He has normal strength. No cranial nerve deficit or sensory deficit. Coordination normal.  Gait and balance are intact.  Skin: Skin is warm and dry.  Psychiatric: He has a normal mood and affect. His speech is normal.  Nursing note and vitals reviewed.    ED Treatments / Results  Labs (all labs ordered are listed, but only abnormal results are displayed) Labs Reviewed - No data to display  EKG  EKG Interpretation None       Radiology No results found.  Procedures Procedures (including critical care time)  Medications Ordered in ED Medications  clindamycin (CLEOCIN) capsule 300 mg (300 mg Oral Given 05/05/17 2114)  ibuprofen (ADVIL,MOTRIN) tablet 800 mg (800 mg Oral Given 05/05/17 2114)  ondansetron (ZOFRAN) tablet 4 mg (4 mg Oral Given 05/05/17 2114)     Initial Impression / Assessment and Plan / ED Course  I have reviewed the triage vital signs and the nursing notes.  Pertinent labs & imaging results that were available during my care of the patient were reviewed by  me and considered in my medical decision making (see chart for details).       Final Clinical Impressions(s) / ED Diagnoses MDM Vital signs within normal limits. The patient has congestion in the nasal passages and some pain to percussion over the sinuses. The left upper premolar has a cavity. There is no evidence for Ludwig's angina. Patient will be treated with clindamycin and ibuprofen and Claritin-D. Patient is to follow-up with the dentist as sone as possible.    Final diagnoses:  Toothache  Sinusitis, unspecified chronicity, unspecified location    New Prescriptions New Prescriptions   No medications on file     Duayne Cal 05/05/17 2204    Bethann Berkshire, MD 05/07/17 1555

## 2017-05-05 NOTE — ED Triage Notes (Signed)
Pt c/o left dental pain x 3 days.

## 2017-05-05 NOTE — ED Notes (Signed)
Provided list of dental services to follow up with

## 2017-05-05 NOTE — Discharge Instructions (Signed)
Your examination questions a cavity of your left upper premolar. Your examination also suggest sinus area congestion. Please use Claritin-D 2 times daily. Use clindamycin and ibuprofen with breakfast, lunch, dinner, and at bedtime. Please see your dentist as soon as possible.

## 2018-02-12 DIAGNOSIS — H35363 Drusen (degenerative) of macula, bilateral: Secondary | ICD-10-CM | POA: Diagnosis not present

## 2018-08-28 DIAGNOSIS — N451 Epididymitis: Secondary | ICD-10-CM | POA: Diagnosis not present

## 2018-08-28 DIAGNOSIS — E785 Hyperlipidemia, unspecified: Secondary | ICD-10-CM | POA: Diagnosis not present

## 2018-08-28 DIAGNOSIS — Z9114 Patient's other noncompliance with medication regimen: Secondary | ICD-10-CM | POA: Diagnosis not present

## 2018-08-28 DIAGNOSIS — Z0001 Encounter for general adult medical examination with abnormal findings: Secondary | ICD-10-CM | POA: Diagnosis not present

## 2018-08-28 DIAGNOSIS — Z0000 Encounter for general adult medical examination without abnormal findings: Secondary | ICD-10-CM | POA: Diagnosis not present

## 2018-08-28 DIAGNOSIS — C61 Malignant neoplasm of prostate: Secondary | ICD-10-CM | POA: Diagnosis not present

## 2018-08-28 DIAGNOSIS — I10 Essential (primary) hypertension: Secondary | ICD-10-CM | POA: Diagnosis not present

## 2018-08-28 DIAGNOSIS — E109 Type 1 diabetes mellitus without complications: Secondary | ICD-10-CM | POA: Diagnosis not present

## 2018-09-01 DIAGNOSIS — N454 Abscess of epididymis or testis: Secondary | ICD-10-CM | POA: Diagnosis not present

## 2018-09-01 DIAGNOSIS — N390 Urinary tract infection, site not specified: Secondary | ICD-10-CM | POA: Diagnosis not present

## 2018-09-01 DIAGNOSIS — A429 Actinomycosis, unspecified: Secondary | ICD-10-CM | POA: Diagnosis not present

## 2018-09-04 DIAGNOSIS — N454 Abscess of epididymis or testis: Secondary | ICD-10-CM | POA: Diagnosis not present

## 2018-10-15 ENCOUNTER — Other Ambulatory Visit: Payer: Self-pay

## 2018-10-15 ENCOUNTER — Encounter (HOSPITAL_COMMUNITY): Payer: Self-pay | Admitting: Emergency Medicine

## 2018-10-15 ENCOUNTER — Emergency Department (HOSPITAL_COMMUNITY)
Admission: EM | Admit: 2018-10-15 | Discharge: 2018-10-15 | Disposition: A | Discharge status: Home / Self Care | Payer: BLUE CROSS/BLUE SHIELD | Attending: Emergency Medicine | Admitting: Emergency Medicine

## 2018-10-15 DIAGNOSIS — E119 Type 2 diabetes mellitus without complications: Secondary | ICD-10-CM | POA: Insufficient documentation

## 2018-10-15 DIAGNOSIS — Z79899 Other long term (current) drug therapy: Secondary | ICD-10-CM | POA: Insufficient documentation

## 2018-10-15 DIAGNOSIS — R0981 Nasal congestion: Secondary | ICD-10-CM | POA: Diagnosis not present

## 2018-10-15 DIAGNOSIS — R112 Nausea with vomiting, unspecified: Secondary | ICD-10-CM | POA: Insufficient documentation

## 2018-10-15 DIAGNOSIS — M791 Myalgia, unspecified site: Secondary | ICD-10-CM | POA: Diagnosis not present

## 2018-10-15 DIAGNOSIS — R05 Cough: Secondary | ICD-10-CM | POA: Diagnosis not present

## 2018-10-15 DIAGNOSIS — F17210 Nicotine dependence, cigarettes, uncomplicated: Secondary | ICD-10-CM | POA: Diagnosis not present

## 2018-10-15 DIAGNOSIS — J111 Influenza due to unidentified influenza virus with other respiratory manifestations: Secondary | ICD-10-CM

## 2018-10-15 DIAGNOSIS — Z794 Long term (current) use of insulin: Secondary | ICD-10-CM | POA: Insufficient documentation

## 2018-10-15 DIAGNOSIS — R509 Fever, unspecified: Secondary | ICD-10-CM | POA: Diagnosis not present

## 2018-10-15 DIAGNOSIS — E1165 Type 2 diabetes mellitus with hyperglycemia: Secondary | ICD-10-CM | POA: Diagnosis not present

## 2018-10-15 DIAGNOSIS — I10 Essential (primary) hypertension: Secondary | ICD-10-CM | POA: Diagnosis not present

## 2018-10-15 LAB — CBC WITH DIFFERENTIAL/PLATELET
ABS IMMATURE GRANULOCYTES: 0.05 10*3/uL (ref 0.00–0.07)
BASOS ABS: 0 10*3/uL (ref 0.0–0.1)
Basophils Relative: 0 %
Eosinophils Absolute: 0.1 10*3/uL (ref 0.0–0.5)
Eosinophils Relative: 1 %
HCT: 47 % (ref 39.0–52.0)
HEMOGLOBIN: 15 g/dL (ref 13.0–17.0)
Immature Granulocytes: 1 %
LYMPHS PCT: 7 %
Lymphs Abs: 0.7 10*3/uL (ref 0.7–4.0)
MCH: 28.2 pg (ref 26.0–34.0)
MCHC: 31.9 g/dL (ref 30.0–36.0)
MCV: 88.3 fL (ref 80.0–100.0)
MONO ABS: 1.2 10*3/uL — AB (ref 0.1–1.0)
Monocytes Relative: 11 %
NEUTROS ABS: 8.7 10*3/uL — AB (ref 1.7–7.7)
Neutrophils Relative %: 80 %
Platelets: 182 10*3/uL (ref 150–400)
RBC: 5.32 MIL/uL (ref 4.22–5.81)
RDW: 13.7 % (ref 11.5–15.5)
WBC: 10.8 10*3/uL — AB (ref 4.0–10.5)
nRBC: 0 % (ref 0.0–0.2)

## 2018-10-15 LAB — BASIC METABOLIC PANEL
Anion gap: 11 (ref 5–15)
BUN: 15 mg/dL (ref 6–20)
CHLORIDE: 100 mmol/L (ref 98–111)
CO2: 23 mmol/L (ref 22–32)
Calcium: 8.8 mg/dL — ABNORMAL LOW (ref 8.9–10.3)
Creatinine, Ser: 1.07 mg/dL (ref 0.61–1.24)
GFR calc Af Amer: >60 mL/min (ref >60)
GFR calc non Af Amer: >60 mL/min (ref >60)
Glucose, Bld: 158 mg/dL — ABNORMAL HIGH (ref 70–99)
Potassium: 4.1 mmol/L (ref 3.5–5.1)
SODIUM: 134 mmol/L — AB (ref 135–145)

## 2018-10-15 LAB — INFLUENZA PANEL BY PCR (TYPE A & B)
Influenza A By PCR: POSITIVE — AB
Influenza B By PCR: NEGATIVE

## 2018-10-15 MED ORDER — IBUPROFEN 800 MG PO TABS
800.0000 mg | ORAL_TABLET | Freq: Once | ORAL | Status: AC
  Administered 2018-10-15: 800 mg via ORAL
  Filled 2018-10-15: qty 1

## 2018-10-15 MED ORDER — ONDANSETRON 8 MG PO TBDP
8.0000 mg | ORAL_TABLET | Freq: Once | ORAL | Status: AC
  Administered 2018-10-15: 8 mg via ORAL
  Filled 2018-10-15: qty 1

## 2018-10-15 MED ORDER — ONDANSETRON 4 MG PO TBDP: 4.0000 mg | ORAL_TABLET | Freq: Three times a day (TID) | ORAL | 0 refills | Status: DC | PRN

## 2018-10-15 NOTE — ED Provider Notes (Signed)
[facility] EMERGENCY DEPARTMENT Provider Note   CSN: [id number] Arrival date & time: 10/15/18  1014    History   Chief Complaint Chief Complaint  Patient presents with  . Influenza    HPI Bryce Stewart is a 53 y.o. male.     Patient has been sick with fever, chills, body aches for the last 2 and half days.  He has had some nausea and one episode of nonbilious nonbloody emesis today.  Sent in by his PCP due to temperature of 103.  He has no chest pain or shortness of breath.  He has no abdominal pain.  He has nasal congestion, dry cough, rhinorrhea.  He last had Tylenol around 6 AM today.  Has had a poor appetite.     Past Medical History:  Diagnosis Date  . Diabetes mellitus without complication (HCC)   . High cholesterol     Patient Active Problem List   Diagnosis Date Noted  . Uncontrolled type 2 diabetes mellitus with complication, with long-term current use of insulin (HCC) 12/31/2016  . Sepsis (HCC) 10/27/2016  . Diabetes mellitus out of control (HCC) 07/25/2016  . Pyelonephritis 07/25/2016  . Tobacco abuse 07/25/2016  . Hypercholesteremia 07/25/2016    Past Surgical History:  Procedure Laterality Date  . CYSTOSCOPY N/A 12/02/2016   Procedure: CYSTOSCOPY WITH INCISION OF PROSTATIC ABSCESS;  Surgeon: Bjorn Pippin, MD;  Location: [facility];  Service: Urology;  Laterality: N/A;  . CYSTOSCOPY WITH URETHRAL DILATATION  12/02/2016   Procedure: CYSTOSCOPY WITH URETHRAL DILATATION;  Surgeon: Bjorn Pippin, MD;  Location: [facility] [place];  Service: Urology;;  . TRANSURETHRAL RESECTION OF PROSTATE  12/02/2016   Procedure: TRANSURETHRAL RESECTION OF THE PROSTATE (TURP);  Surgeon: Bjorn Pippin, MD;  Location: [facility];  Service: Urology;;     OB History   No obstetric history on file.      Home Medications    Prior to Admission medications   Medication Sig Start Date End Date Taking? Authorizing Provider  atorvastatin (LIPITOR) 20  MG tablet Take 20 mg by mouth daily.    [provider]  clindamycin (CLEOCIN) 150 MG capsule Take 1 capsule (150 mg total) by mouth every 6 (six) hours. 05/05/17   Ivery Quale, PA-C  ibuprofen (ADVIL,MOTRIN) 600 MG tablet Take 1 tablet (600 mg total) by mouth 4 (four) times daily. 05/05/17   Ivery Quale, PA-C  insulin NPH-regular Human (NOVOLIN 70/30) (70-30) 100 UNIT/ML injection Inject 30 Units into the skin 2 (two) times daily with a meal.    [provider]  loratadine-pseudoephedrine (CLARITIN-D 12 HOUR) 5-120 MG tablet Take 1 tablet by mouth 2 (two) times daily. 05/05/17   Ivery Quale, PA-C  metFORMIN (GLUCOPHAGE) 500 MG tablet Take 1 tablet (500 mg total) by mouth 2 (two) times daily with a meal. 07/27/16   Kari Baars, MD    Family History Family History  Problem Relation Age of Onset  . Thyroid disease Mother   . Diabetes Maternal Aunt   . Diabetes Maternal Uncle   . Diabetes Paternal Aunt   . Diabetes Paternal Uncle   . Diabetes Maternal Grandmother     Social History Social History   Tobacco Use  . Smoking status: Current Every Day Smoker    Packs/day: 0.50  . Smokeless tobacco: Never Used  Substance Use Topics  . Alcohol use: No  . Drug use: No     Allergies   Patient has no known allergies.  Review of Systems Review of Systems  Constitutional: Positive for chills and fever.  HENT: Positive for congestion and rhinorrhea.   Respiratory: Positive for cough. Negative for shortness of breath and wheezing.   Cardiovascular: Negative for chest pain.  Gastrointestinal: Positive for nausea and vomiting. Negative for abdominal pain, constipation and diarrhea.  Genitourinary: Negative for difficulty urinating and dysuria.  Musculoskeletal: Positive for myalgias.     Physical Exam Updated Vital Signs BP 123/69 (BP Location: Right Arm)   Pulse (!) 104   Temp (!) 103 F (39.4 C) (Oral)   Resp (!) 22   Ht  (1.702 m)   Wt 76.7 kg    SpO2 95%   BMI 26.47 kg/m   Physical Exam Constitutional:      General: He is not in acute distress.    Appearance: Normal appearance. He is not toxic-appearing or diaphoretic.  HENT:     Head: Normocephalic and atraumatic.     Right Ear: External ear normal.     Left Ear: External ear normal.     Nose: Congestion present. No rhinorrhea.     Mouth/Throat:     Mouth: Mucous membranes are moist.     Pharynx: Oropharynx is clear. No oropharyngeal exudate or posterior oropharyngeal erythema.  Eyes:     Conjunctiva/sclera: Conjunctivae normal.  Neck:     Musculoskeletal: Normal range of motion.  Cardiovascular:     Rate and Rhythm: Regular rhythm. Tachycardia present.     Heart sounds: Normal heart sounds. No murmur.  Pulmonary:     Effort: Pulmonary effort is normal.     Breath sounds: Normal breath sounds. No wheezing, rhonchi or rales.  Abdominal:     General: Abdomen is flat. Bowel sounds are normal.     Tenderness: There is no abdominal tenderness. There is no guarding or rebound.  Musculoskeletal: Normal range of motion.  Skin:    General: Skin is warm and dry.     Capillary Refill: Capillary refill takes 2 to 3 seconds.  Neurological:     General: No focal deficit present.     Mental Status: He is alert and oriented to person, place, and time.  Psychiatric:        Mood and Affect: Mood normal.      ED Treatments / Results  Labs (all labs ordered are listed, but only abnormal results are displayed) Labs Reviewed  INFLUENZA PANEL BY PCR (TYPE A & B) - Abnormal; Notable for the following components:      Result Value   Influenza A By PCR POSITIVE (*)    All other components within normal limits  BASIC METABOLIC PANEL - Abnormal; Notable for the following components:   Sodium 134 (*)    Glucose, Bld 158 (*)    Calcium 8.8 (*)    All other components within normal limits  CBC WITH DIFFERENTIAL/PLATELET - Abnormal; Notable for the following components:   WBC 10.8  (*)    Neutro Abs 8.7 (*)    Monocytes Absolute 1.2 (*)    All other components within normal limits    EKG None  Radiology No results found.  Procedures Procedures (including critical care time)  Medications Ordered in ED Medications  ondansetron (ZOFRAN-ODT) disintegrating tablet 8 mg (has no administration in time range)  ibuprofen (ADVIL,MOTRIN) tablet 800 mg (has no administration in time range)     Initial Impression / Assessment and Plan / ED Course  I have reviewed the triage vital signs and  the nursing notes.  Pertinent labs & imaging results that were available during my care of the patient were reviewed by me and considered in my medical decision making (see chart for details).       Patient positive for influenza A. Drinking well by mouth with normal WOB and clear lung exam. Is outside window for tamiflu. Discussed supportive care at home with good po hydration, rest, as needed tylenol/motrin.   Final Clinical Impressions(s) / ED Diagnoses   Final diagnoses:  Influenza    ED Discharge Orders    None       Leland Her, DO 10/15/18 1231    Blane Ohara, MD 10/18/18 [id number]

## 2018-10-15 NOTE — ED Triage Notes (Signed)
Cough, fever, body aches. Vomited x 1 this morning.  Sent by pcp for temp of 103

## 2018-10-15 NOTE — Discharge Instructions (Addendum)
Stay well hydrated.

## 2018-12-23 DIAGNOSIS — R81 Glycosuria: Secondary | ICD-10-CM | POA: Diagnosis not present

## 2018-12-23 DIAGNOSIS — Z87440 Personal history of urinary (tract) infections: Secondary | ICD-10-CM | POA: Diagnosis not present

## 2018-12-23 DIAGNOSIS — R3912 Poor urinary stream: Secondary | ICD-10-CM | POA: Diagnosis not present

## 2018-12-23 DIAGNOSIS — N35011 Post-traumatic bulbous urethral stricture: Secondary | ICD-10-CM | POA: Diagnosis not present

## 2019-11-24 ENCOUNTER — Other Ambulatory Visit: Payer: Self-pay

## 2019-11-24 ENCOUNTER — Encounter (HOSPITAL_COMMUNITY): Payer: Self-pay | Admitting: Emergency Medicine

## 2019-11-24 ENCOUNTER — Emergency Department (HOSPITAL_COMMUNITY)
Admission: EM | Admit: 2019-11-24 | Discharge: 2019-11-25 | Disposition: A | Discharge status: Home / Self Care | Payer: Managed Care, Other (non HMO) | Attending: Emergency Medicine | Admitting: Emergency Medicine

## 2019-11-24 DIAGNOSIS — R030 Elevated blood-pressure reading, without diagnosis of hypertension: Secondary | ICD-10-CM | POA: Insufficient documentation

## 2019-11-24 DIAGNOSIS — Z79899 Other long term (current) drug therapy: Secondary | ICD-10-CM | POA: Insufficient documentation

## 2019-11-24 DIAGNOSIS — R519 Headache, unspecified: Secondary | ICD-10-CM | POA: Diagnosis not present

## 2019-11-24 DIAGNOSIS — E119 Type 2 diabetes mellitus without complications: Secondary | ICD-10-CM | POA: Insufficient documentation

## 2019-11-24 DIAGNOSIS — Z794 Long term (current) use of insulin: Secondary | ICD-10-CM | POA: Insufficient documentation

## 2019-11-24 DIAGNOSIS — F17210 Nicotine dependence, cigarettes, uncomplicated: Secondary | ICD-10-CM | POA: Diagnosis not present

## 2019-11-24 NOTE — Discharge Instructions (Signed)
Your blood pressure today was slightly elevated.  Please have it checked several times in the next 1-2 weeks.  If it is consistently high, you will need to be on medication to control it.  Inadequately treated high blood pressure can lead to heart attacks, strokes, kidney failure.  Return if you have any further problems with your headache.

## 2019-11-24 NOTE — ED Provider Notes (Signed)
[facility] EMERGENCY DEPARTMENT Provider Note   CSN: [id number] Arrival date & time: 11/24/19  2219   History Chief Complaint  Patient presents with  . Headache    Bryce Stewart is a 54 y.o. adult.  The history is provided by the patient.  Headache He has history of diabetes, hyperlipidemia and comes in because of an episode of dizziness followed by a headache.  He states that when he left to go to work this afternoon, there was a brief episode of dizziness where he felt like he was unsteady on his feet.  This passed rather quickly and was followed by a bifrontal headache.  Headache was throbbing in nature and rated at 4/10 at its worst.  There was no associated photophobia or phonophobia.  There was no nausea or vomiting.  He took a dose of ibuprofen and has had complete relief of the headache.  He came to the emergency department because his wife wanted him to have it investigated since he does not usually get headaches or dizzy spells.  He is completely symptom-free currently.  Past Medical History:  Diagnosis Date  . Diabetes mellitus without complication ([place])   . High cholesterol     Patient Active Problem List   Diagnosis Date Noted  . Uncontrolled type 2 diabetes mellitus with complication, with long-term current use of insulin ([place]) 12/31/2016  . Sepsis ([place]) 10/27/2016  . Diabetes mellitus out of control ([place]) 07/25/2016  . Pyelonephritis 07/25/2016  . Tobacco abuse 07/25/2016  . Hypercholesteremia 07/25/2016    Past Surgical History:  Procedure Laterality Date  . CYSTOSCOPY N/A 12/02/2016   Procedure: CYSTOSCOPY WITH INCISION OF PROSTATIC ABSCESS;  Surgeon: Irine Seal, MD;  Location: [facility];  Service: Urology;  Laterality: N/A;  . CYSTOSCOPY WITH URETHRAL DILATATION  12/02/2016   Procedure: CYSTOSCOPY WITH URETHRAL DILATATION;  Surgeon: Irine Seal, MD;  Location: [place];  Service: Urology;;  . TRANSURETHRAL RESECTION OF PROSTATE   12/02/2016   Procedure: TRANSURETHRAL RESECTION OF THE PROSTATE (TURP);  Surgeon: Irine Seal, MD;  Location: [facility];  Service: Urology;;     OB History   No obstetric history on file.     Family History  Problem Relation Age of Onset  . Thyroid disease Mother   . Diabetes Maternal Aunt   . Diabetes Maternal Uncle   . Diabetes Paternal Aunt   . Diabetes Paternal Uncle   . Diabetes Maternal Grandmother     Social History   Tobacco Use  . Smoking status: Current Every Day Smoker    Packs/day: 0.50  . Smokeless tobacco: Never Used  Substance Use Topics  . Alcohol use: No  . Drug use: No    Home Medications Prior to Admission medications   Medication Sig Start Date End Date Taking? Authorizing Provider  atorvastatin (LIPITOR) 20 MG tablet Take 20 mg by mouth daily.    [provider]  clindamycin (CLEOCIN) 150 MG capsule Take 1 capsule (150 mg total) by mouth every 6 (six) hours. 05/05/17   Lily Kocher, PA-C  ibuprofen (ADVIL,MOTRIN) 600 MG tablet Take 1 tablet (600 mg total) by mouth 4 (four) times daily. 05/05/17   Lily Kocher, PA-C  insulin NPH-regular Human (NOVOLIN 70/30) (70-30) 100 UNIT/ML injection Inject 30 Units into the skin 2 (two) times daily with a meal.    [provider]  loratadine-pseudoephedrine (CLARITIN-D 12 HOUR) 5-120 MG tablet Take 1 tablet by mouth 2 (two) times daily. 05/05/17  Ivery Quale, PA-C  metFORMIN (GLUCOPHAGE) 500 MG tablet Take 1 tablet (500 mg total) by mouth 2 (two) times daily with a meal. 07/27/16   Kari Baars, MD  ondansetron (ZOFRAN ODT) 4 MG disintegrating tablet Take 1 tablet (4 mg total) by mouth every 8 (eight) hours as needed for nausea or vomiting. 10/15/18   Leland Her, DO    Allergies    Patient has no known allergies.  Review of Systems   Review of Systems  Neurological: Positive for headaches.  All other systems reviewed and are negative.   Physical Exam Updated Vital  Signs BP (!) 175/85 (BP Location: Right Arm)   Pulse 78   Temp 98.3 F (36.8 C) (Oral)   Resp 16   Ht 5\' 7"  (1.702 m)   Wt 84.4 kg   SpO2 99%   BMI 29.13 kg/m   Physical Exam Vitals and nursing note reviewed.   55 year old male, resting comfortably and in no acute distress. Vital signs are significant for elevated blood pressure. Oxygen saturation is 99%, which is normal. Head is normocephalic and atraumatic. PERRLA, EOMI. Oropharynx is clear.  There is no tenderness to palpation of the temporalis muscle or insertion of paracervical muscles. Neck is nontender and supple without adenopathy or JVD. Back is nontender and there is no CVA tenderness. Lungs are clear without rales, wheezes, or rhonchi. Chest is nontender. Heart has regular rate and rhythm without murmur. Abdomen is soft, flat, nontender without masses or hepatosplenomegaly and peristalsis is normoactive. Extremities have no cyanosis or edema, full range of motion is present. Skin is warm and dry without rash. Neurologic: Mental status is normal, cranial nerves are intact, there are no motor or sensory deficits.  ED Results / Procedures / Treatments    Procedures Procedures   Medications Ordered in ED Medications - No data to display  ED Course  I have reviewed the triage vital signs and the nursing notes.  Benign headache of uncertain cause.  No red flags to suggest serious causes of headache.  No indication for imaging.  Elevated blood pressure.  Recommended that he have his blood pressure checked several times in the next 2weeks, follow-up with his primary care provider if blood pressure continues to be elevated.  Old records reviewed, and he has no relevant past visits.  MDM Rules/Calculators/A&P  Final Clinical Impression(s) / ED Diagnoses Final diagnoses:  Headache, unspecified headache type  Elevated blood pressure reading without diagnosis of hypertension    Rx / DC Orders ED Discharge Orders     None       44, MD 11/24/19 2356

## 2019-11-24 NOTE — ED Triage Notes (Signed)
Headache since 2 pm   Unrelieved by ibuprofen at 2020  Here for eval

## 2020-09-19 DIAGNOSIS — U071 COVID-19: Secondary | ICD-10-CM

## 2020-09-19 HISTORY — DX: COVID-19: U07.1

## 2020-12-15 ENCOUNTER — Other Ambulatory Visit: Payer: Self-pay | Admitting: Urology

## 2020-12-15 DIAGNOSIS — N35011 Post-traumatic bulbous urethral stricture: Secondary | ICD-10-CM

## 2020-12-27 ENCOUNTER — Ambulatory Visit (HOSPITAL_COMMUNITY)
Admission: RE | Admit: 2020-12-27 | Discharge: 2020-12-27 | Disposition: A | Discharge status: Home / Self Care | Payer: Managed Care, Other (non HMO) | Source: Ambulatory Visit | Attending: Urology | Admitting: Urology

## 2020-12-27 ENCOUNTER — Other Ambulatory Visit: Payer: Self-pay

## 2020-12-27 DIAGNOSIS — N35011 Post-traumatic bulbous urethral stricture: Secondary | ICD-10-CM | POA: Insufficient documentation

## 2020-12-27 MED ORDER — IOTHALAMATE MEGLUMINE 17.2 % UR SOLN
60.0000 mL | Freq: Once | URETHRAL | Status: AC | PRN
  Administered 2020-12-27: 60 mL via INTRAVESICAL

## 2020-12-28 ENCOUNTER — Other Ambulatory Visit: Payer: Self-pay | Admitting: Urology

## 2021-01-24 ENCOUNTER — Other Ambulatory Visit: Payer: Self-pay

## 2021-01-24 ENCOUNTER — Encounter (HOSPITAL_BASED_OUTPATIENT_CLINIC_OR_DEPARTMENT_OTHER): Payer: Self-pay | Admitting: Urology

## 2021-01-24 NOTE — H&P (Signed)
CC: Weak stream.   12/13/20: Bryce Stewart returns today in f/u with progressive slowing of his urinary stream. He has mild frequency and nocturia x 2. His PVR is 47ml. he has a Hx of TURP and urethral dilation for a prostatic abscess and severe bulbar stricture on 12/02/16. He has some pyuria and bacteria on UA today. he has no dysuria or hematuria. he has ED and is interested in oral meds. He has about a 20% partial erection.    5/6/2020[name] returns today for a PVR and UA recheck. He has recovered from the scrotal abscess that occurred in 1/20.Bryce Stewart He is voiding well with an IPSS of 6 and a PVR of . His UA is clear today but he has 3+ Glucose as usual. He has no associated signs or symptoms.     CC: AUA Questions Scoring.  HPI: Bryce Stewart is a 55 year-old male established patient who is here for evaluation.      AUA Symptom Score: Less than 20% of the time he has the sensation of not emptying his bladder completely when finished urinating. He never has to urinate again less that two hours after he has finished urinating. He does not have to stop and start again several times when he urinates. He never finds it difficult to postpone urination. Almost always he has a weak urinary stream. He never has to push or strain to begin urination. He has to get up to urinate 2 times from the time he goes to bed until the time he gets up in the morning.   Calculated AUA Symptom Score: 8    ALLERGIES: None   MEDICATIONS: Lisinopril 10 mg tablet  Metformin Hcl 500 mg tablet  Atorvastatin Calcium 20 mg tablet  Novolog     GU PSH: Complex Uroflow - 2018 Cysto Dilate Stricture (M or F) - 2018 Cystoscopy TURP - 2018 Drain Prostate Abscess. - 2018 Locm 300-399Mg /Ml Iodine,1Ml - 2018     NON-GU PSH: Dental Surgery Procedure Drain Skin Abscess - 2020     GU PMH: Personal Hx Urinary Tract Infections, UA is clear. He will return in a year with a flowrate and PVR. - 12/23/2018 Inflammatory disorder of  unspecified male genital organ - 2020, (Improving), - 2020, - 2020 Abscess of epididymis / testis (Acute), Left - 2020 Bulbar urethral stricture (Improving), He is doing well s/p TURP and dilation. His flow is still reduced with a box like pattern which is consistent with some stricture recurrence. He is content with the symptoms so I will just have him return in 3 months for a repeat flowrate and PVR. - 2018 Prostatic abscess, His UA is clear today. - 2018 Retrograde ejaculation, He has retrograde ejaculation following the procedure. - 2018 Dysuria, Urine culture today. - 2018 ED due to arterial insufficiency, He has ED that began in december that could be secondary to his DM but he could be hypogonadol as well. - 2018 Gross hematuria, I am going to get a CT hematuria study and will probably need to do cystoscopy but he has meatal stenosis and will likely need it as an outpatient. - 2018 Hemorrhagic cystitis (w/o hematuria), His UA looks infected today and a culture will be obtained. - 2018 Prostate nodule w/ LUTS, He has an abnormal prostate exam that is worrisome for neoplasia and has associated obstructive voiding symptoms and penile pain and he has had a 20lb weight loss. PSA today and I will set him up for a prostate biopsy at  some point in the near future depending on that result along with the CT and culture. - 2018 Testicular atrophy, He has bilateral testicular atrophy so I will get a testosterone level on him. - 2018 Urinary Hesitancy - 2018 Weak Urinary Stream, will get a flowrate and PVR on return. - 2018 Family history of kidney cancer      PMH Notes: hemorrhoidal pain   NON-GU PMH: Glycosuria, He has not been compliant with his meds and I encouraged him to try to take better care of him self in regards to the DM. - 12/23/2018 Diabetes Type 2 Hypercholesterolemia Hypertension    FAMILY HISTORY: Diabetes - Runs in Family father deceased - Father Thyroid Disease - Mother    SOCIAL HISTORY: Marital Status: Married Preferred Language: English; Ethnicity: Not Hispanic Or Latino; Race: Black or African American Current Smoking Status: Patient smokes.   Tobacco Use Assessment Completed: Used Tobacco in last 30 days? Has never drank.  Does not use drugs. Drinks 1 caffeinated drink per day. Has not had a blood transfusion. 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     Notes: 1 son 2 daughters   REVIEW OF SYSTEMS:    GU Review Male:   Patient denies frequent urination, hard to postpone urination, burning/ pain with urination, get up at night to urinate, leakage of urine, stream starts and stops, trouble starting your stream, have to strain to urinate , erection problems, and penile pain.  Gastrointestinal (Upper):   Patient denies nausea, vomiting, and indigestion/ heartburn.  Gastrointestinal (Lower):   Patient denies diarrhea and constipation.  Constitutional:   Patient denies fever, night sweats, weight loss, and fatigue.  Skin:   Patient denies skin rash/ lesion and itching.  Eyes:   Patient denies blurred vision and double vision.  Ears/ Nose/ Throat:   Patient denies sinus problems and sore throat.  Hematologic/Lymphatic:   Patient denies swollen glands and easy bruising.  Cardiovascular:   Patient denies leg swelling and chest pains.  Respiratory:   Patient denies cough and shortness of breath.  Endocrine:   Patient denies excessive thirst.  Musculoskeletal:   Patient denies back pain and joint pain.  Neurological:   Patient denies headaches and dizziness.  Psychologic:   Patient denies depression and anxiety.   VITAL SIGNS: None   GU PHYSICAL EXAMINATION:    Scrotum: No lesions. No edema. No cysts. No warts.  Epididymides: Right: no spermatocele, no masses, no cysts, no tenderness, no induration, no enlargement. Left: no spermatocele, no masses, no cysts, no tenderness, no induration, no enlargement.  Testes: No tenderness, no swelling, no enlargement  left testes. No tenderness, no swelling, no enlargement right testes. Normal location left testes. Normal location right testes. No mass, no cyst, no varicocele, no hydrocele left testes. No mass, no cyst, no varicocele, no hydrocele right testes.  Urethral Meatus: Normal size. No lesion, no wart, no discharge, no polyp. Normal location.  Penis: Circumcised, no warts, no cracks. No dorsal Peyronie's plaques, no left corporal Peyronie's plaques, no right corporal Peyronie's plaques, no scarring, no warts. No balanitis, no meatal stenosis.  Prostate: declined   MULTI-SYSTEM PHYSICAL EXAMINATION:    Constitutional: Well-nourished. No physical deformities. Normally developed. Good grooming.  Respiratory: Normal breath sounds. No labored breathing, no use of accessory muscles.   Cardiovascular: Regular rate and rhythm. No murmur, no gallop.   Skin: No paleness, no jaundice, no cyanosis. No lesion, no ulcer, no rash.  Neurologic / Psychiatric: Oriented to time, oriented to place, oriented to  person. No depression, no anxiety, no agitation.  Gastrointestinal: No hernia. No mass, no tenderness, no rigidity, non obese abdomen.   Musculoskeletal: Normal gait and station of head and neck.     Complexity of Data:  Records Review:   AUA Symptom Score, Previous Patient Records  Urine Test Review:   Urinalysis  Urodynamics Review:   Review Bladder Scan   11/27/16  PSA  Total PSA 0.18 ng/dl    45/36/46  Hormones  Testosterone, Total 419.9 pg/dL    PROCEDURES:         PVR Ultrasound - 80321  Scanned Volume: 28 cc         UTI+ - 87481, 87500, 87640, 22482, 50037, 04888, 91694 UTI Plus has been ordered to detect urinary organisms by PCR.          Urinalysis w/Scope Dipstick Dipstick Cont'd Micro  Color: Yellow Bilirubin: Neg mg/dL WBC/hpf: 10 - 50/TUU  Appearance: Clear Ketones: Neg mg/dL RBC/hpf: 0 - 2/hpf  Specific Gravity: >1.030 Blood: Neg ery/uL Bacteria: Few (10-25/hpf)  pH: 6.0 Protein:  1+ mg/dL Cystals: NS (Not Seen)  Glucose: 2+ mg/dL Urobilinogen: 1.0 mg/dL Casts: NS (Not Seen)    Nitrites: Neg Trichomonas: Not Present    Leukocyte Esterase: Trace leu/uL Mucous: Not Present      Epithelial Cells: 6 - 10/hpf      Yeast: NS (Not Seen)      Sperm: Not Present    ASSESSMENT:      ICD-10 Details  1 GU:   Bulbar urethral stricture - N35.011 Chronic, Worsening - His urine stream has declined and this is most consistent with a recurrent stricture. I will get him set up for a retrograde urethrogram and if that shows a stricture, I will schedule him for balloon dilation with the Optilume balloon. Risks of bleeding, infection, recurrent stricture, thrombotic events and anesthetic complications reviewed.   2   Weak Urinary Stream - R39.12 Chronic, Worsening  5   ED due to arterial insufficiency - N52.01 Chronic, Worsening - I discussed options and gave him a script for sildenafil 20mg  to take 1-5 po prn. Side effects and instructions reviewed.   3 NON-GU:   Bacteriuria - R82.71 Undiagnosed New Problem - I will get a UTI plus.   4   Pyuria/other UA findings - R82.79 Undiagnosed New Problem   PLAN:            Medications New Meds: Sildenafil Citrate 20 mg tablet 1-5 po prn 1 hour prior to activity   #30  11 Refill(s)            Orders X-Rays: Outside X-Ray Without Contrast - He needs a retrograde urethrogram done at [facility].           Schedule Return Visit/Planned Activity: Next Available Appointment - Schedule Surgery             Note: this will need to be scheduled for after a retrograde urethrogram that has been ordered at [facility].

## 2021-01-24 NOTE — Progress Notes (Signed)
Spoke w/ via phone for pre-op interview---pt and pt wife reka per pt request Lab needs dos----I stat, ekg               Lab results------none (old ekg 10-27-2016 chart/epic) COVID test -----patient states asymptomatic no test needed Arrive at -------645 am 01-26-2021 NPO after MN NO Solid Food.  Clear liquids from MN until---545 am then npo Med rec completed Medications to take morning of surgery -----none Diabetic medication -----none day of surgery Patient instructed no nail polish to be worn day of surgery Patient instructed to bring photo id and insurance card day of surgery Patient aware to have Driver (ride ) / caregiver   wife reka cell [phone] will stay  for 24 hours after surgery  Patient Special Instructions -----patient reports has not taken glipizide since jan 2022, patient instructed to start taking glipizide today, and to watch sugar intake prior to surgery, pt vocalized understanding Pre-Op special Istructions -----no smoking 24 hours before surgery Patient verbalized understanding of instructions that were given at this phone interview. Patient denies shortness of breath, chest pain, fever, cough at this phone interview.  Left voice mail message for pam gibson pt off dm med since jan 2022, pt instructed to restart glipizide today, pam to please let dr Annabell Howells know

## 2021-01-25 NOTE — Anesthesia Preprocedure Evaluation (Addendum)
Anesthesia Evaluation  Patient identified by MRN, date of birth, ID band Patient awake    Reviewed: Allergy & Precautions, NPO status , Patient's Chart, lab work & pertinent test results  Airway Mallampati: II  TM Distance: >3 FB Neck ROM: Full    Dental  (+) Missing, Dental Advisory Given, Chipped, Poor Dentition,    Pulmonary Current Smoker,    Pulmonary exam normal breath sounds clear to auscultation       Cardiovascular Exercise Tolerance: Good hypertension, Pt. on medications Normal cardiovascular exam Rhythm:Regular Rate:Normal  HLD   Neuro/Psych negative neurological ROS  negative psych ROS   GI/Hepatic negative GI ROS, Neg liver ROS,   Endo/Other  diabetes, Type 2, Oral Hypoglycemic Agents, Insulin Dependent  Renal/GU negative Renal ROS     Musculoskeletal negative musculoskeletal ROS (+)   Abdominal   Peds  Hematology negative hematology ROS (+)   Anesthesia Other Findings Day of surgery medications reviewed with the patient.  Reproductive/Obstetrics                           Anesthesia Physical  Anesthesia Plan  ASA: 2  Anesthesia Plan: General   Post-op Pain Management:    Induction: Intravenous  PONV Risk Score and Plan: 2 and Ondansetron, Dexamethasone, Midazolam and Treatment may vary due to age or medical condition  Airway Management Planned: LMA  Additional Equipment: None  Intra-op Plan:   Post-operative Plan: Extubation in OR  Informed Consent: I have reviewed the patients History and Physical, chart, labs and discussed the procedure including the risks, benefits and alternatives for the proposed anesthesia with the patient or authorized representative who has indicated his/her understanding and acceptance.     Dental advisory given  Plan Discussed with: CRNA  Anesthesia Plan Comments:        Anesthesia Quick Evaluation

## 2021-01-26 ENCOUNTER — Ambulatory Visit (HOSPITAL_BASED_OUTPATIENT_CLINIC_OR_DEPARTMENT_OTHER): Payer: Managed Care, Other (non HMO) | Admitting: Anesthesiology

## 2021-01-26 ENCOUNTER — Encounter (HOSPITAL_BASED_OUTPATIENT_CLINIC_OR_DEPARTMENT_OTHER): Payer: Self-pay | Admitting: Urology

## 2021-01-26 ENCOUNTER — Ambulatory Visit (HOSPITAL_BASED_OUTPATIENT_CLINIC_OR_DEPARTMENT_OTHER)
Admission: RE | Admit: 2021-01-26 | Discharge: 2021-01-26 | Disposition: A | Discharge status: Home / Self Care | Payer: Managed Care, Other (non HMO) | Attending: Urology | Admitting: Urology

## 2021-01-26 ENCOUNTER — Other Ambulatory Visit: Payer: Self-pay

## 2021-01-26 ENCOUNTER — Encounter (HOSPITAL_BASED_OUTPATIENT_CLINIC_OR_DEPARTMENT_OTHER)
Admission: RE | Disposition: A | Discharge status: Home / Self Care | Payer: Self-pay | Source: Home / Self Care | Attending: Urology

## 2021-01-26 DIAGNOSIS — N3289 Other specified disorders of bladder: Secondary | ICD-10-CM | POA: Diagnosis not present

## 2021-01-26 DIAGNOSIS — R8271 Bacteriuria: Secondary | ICD-10-CM | POA: Diagnosis not present

## 2021-01-26 DIAGNOSIS — R3912 Poor urinary stream: Secondary | ICD-10-CM | POA: Diagnosis not present

## 2021-01-26 DIAGNOSIS — Z79899 Other long term (current) drug therapy: Secondary | ICD-10-CM | POA: Insufficient documentation

## 2021-01-26 DIAGNOSIS — F17200 Nicotine dependence, unspecified, uncomplicated: Secondary | ICD-10-CM | POA: Diagnosis not present

## 2021-01-26 DIAGNOSIS — N35912 Unspecified bulbous urethral stricture, male: Secondary | ICD-10-CM | POA: Insufficient documentation

## 2021-01-26 DIAGNOSIS — N35112 Postinfective bulbous urethral stricture, not elsewhere classified: Secondary | ICD-10-CM

## 2021-01-26 DIAGNOSIS — R8279 Other abnormal findings on microbiological examination of urine: Secondary | ICD-10-CM | POA: Diagnosis not present

## 2021-01-26 DIAGNOSIS — Z7984 Long term (current) use of oral hypoglycemic drugs: Secondary | ICD-10-CM | POA: Diagnosis not present

## 2021-01-26 DIAGNOSIS — N5201 Erectile dysfunction due to arterial insufficiency: Secondary | ICD-10-CM | POA: Diagnosis not present

## 2021-01-26 DIAGNOSIS — Z794 Long term (current) use of insulin: Secondary | ICD-10-CM | POA: Insufficient documentation

## 2021-01-26 HISTORY — DX: Poor urinary stream: R39.12

## 2021-01-26 HISTORY — DX: Type 2 diabetes mellitus without complications: E11.9

## 2021-01-26 HISTORY — DX: Essential (primary) hypertension: I10

## 2021-01-26 HISTORY — DX: Presence of spectacles and contact lenses: Z97.3

## 2021-01-26 HISTORY — DX: Unspecified urethral stricture, male, unspecified site: N35.919

## 2021-01-26 HISTORY — PX: CYSTOSCOPY WITH URETHRAL DILATATION: SHX5125

## 2021-01-26 LAB — GLUCOSE, CAPILLARY: Glucose-Capillary: 154 mg/dL — ABNORMAL HIGH (ref 70–99)

## 2021-01-26 LAB — POCT I-STAT, CHEM 8
BUN: 22 mg/dL — ABNORMAL HIGH (ref 6–20)
Calcium, Ion: 1.31 mmol/L (ref 1.15–1.40)
Chloride: 103 mmol/L (ref 98–111)
Creatinine, Ser: 1.1 mg/dL (ref 0.61–1.24)
Glucose, Bld: 160 mg/dL — ABNORMAL HIGH (ref 70–99)
HCT: 44 % (ref 39.0–52.0)
Hemoglobin: 15 g/dL (ref 13.0–17.0)
Potassium: 3.7 mmol/L (ref 3.5–5.1)
Sodium: 139 mmol/L (ref 135–145)
TCO2: 24 mmol/L (ref 22–32)

## 2021-01-26 SURGERY — CYSTOSCOPY, WITH URETHRAL DILATION
Anesthesia: General | Site: Urethra

## 2021-01-26 MED ORDER — HYDROCODONE-ACETAMINOPHEN 5-325 MG PO TABS: 1.0000 | ORAL_TABLET | Freq: Four times a day (QID) | ORAL | 0 refills | Status: AC | PRN

## 2021-01-26 MED ORDER — ONDANSETRON HCL 4 MG/2ML IJ SOLN
INTRAMUSCULAR | Status: DC | PRN
  Administered 2021-01-26: 4 mg via INTRAVENOUS

## 2021-01-26 MED ORDER — SODIUM CHLORIDE 0.9 % IV SOLN
INTRAVENOUS | Status: DC | PRN
  Administered 2021-01-26: 10 mL

## 2021-01-26 MED ORDER — ARTIFICIAL TEARS OPHTHALMIC OINT
TOPICAL_OINTMENT | OPHTHALMIC | Status: AC
  Filled 2021-01-26: qty 3.5

## 2021-01-26 MED ORDER — PHENYLEPHRINE 40 MCG/ML (10ML) SYRINGE FOR IV PUSH (FOR BLOOD PRESSURE SUPPORT)
PREFILLED_SYRINGE | INTRAVENOUS | Status: AC
  Filled 2021-01-26: qty 10

## 2021-01-26 MED ORDER — FENTANYL CITRATE (PF) 100 MCG/2ML IJ SOLN
INTRAMUSCULAR | Status: DC | PRN
  Administered 2021-01-26 (×2): 50 ug via INTRAVENOUS

## 2021-01-26 MED ORDER — LACTATED RINGERS IV SOLN: INTRAVENOUS | Status: DC

## 2021-01-26 MED ORDER — MIDAZOLAM HCL 2 MG/2ML IJ SOLN
INTRAMUSCULAR | Status: AC
  Filled 2021-01-26: qty 2

## 2021-01-26 MED ORDER — FENTANYL CITRATE (PF) 100 MCG/2ML IJ SOLN
INTRAMUSCULAR | Status: AC
  Filled 2021-01-26: qty 2

## 2021-01-26 MED ORDER — LIDOCAINE 2% (20 MG/ML) 5 ML SYRINGE
INTRAMUSCULAR | Status: DC | PRN
  Administered 2021-01-26: 80 mg via INTRAVENOUS

## 2021-01-26 MED ORDER — FENTANYL CITRATE (PF) 100 MCG/2ML IJ SOLN
25.0000 ug | INTRAMUSCULAR | Status: DC | PRN
  Administered 2021-01-26: 50 ug via INTRAVENOUS

## 2021-01-26 MED ORDER — PROMETHAZINE HCL 25 MG/ML IJ SOLN: 6.2500 mg | INTRAMUSCULAR | Status: DC | PRN

## 2021-01-26 MED ORDER — MEPERIDINE HCL 25 MG/ML IJ SOLN: 6.2500 mg | INTRAMUSCULAR | Status: DC | PRN

## 2021-01-26 MED ORDER — PHENYLEPHRINE 40 MCG/ML (10ML) SYRINGE FOR IV PUSH (FOR BLOOD PRESSURE SUPPORT)
PREFILLED_SYRINGE | INTRAVENOUS | Status: DC | PRN
  Administered 2021-01-26: 80 ug via INTRAVENOUS
  Administered 2021-01-26: 120 ug via INTRAVENOUS
  Administered 2021-01-26: 80 ug via INTRAVENOUS
  Administered 2021-01-26: 120 ug via INTRAVENOUS

## 2021-01-26 MED ORDER — DEXAMETHASONE SODIUM PHOSPHATE 10 MG/ML IJ SOLN
INTRAMUSCULAR | Status: DC | PRN
  Administered 2021-01-26: 5 mg via INTRAVENOUS

## 2021-01-26 MED ORDER — CEFAZOLIN SODIUM-DEXTROSE 2-4 GM/100ML-% IV SOLN
2.0000 g | INTRAVENOUS | Status: AC
  Administered 2021-01-26: 2 g via INTRAVENOUS

## 2021-01-26 MED ORDER — STERILE WATER FOR IRRIGATION IR SOLN
Status: DC | PRN
  Administered 2021-01-26: 3000 mL

## 2021-01-26 MED ORDER — MIDAZOLAM HCL 2 MG/2ML IJ SOLN
INTRAMUSCULAR | Status: DC | PRN
  Administered 2021-01-26: 2 mg via INTRAVENOUS

## 2021-01-26 MED ORDER — SODIUM CHLORIDE 0.9% FLUSH: 3.0000 mL | Freq: Two times a day (BID) | INTRAVENOUS | Status: DC

## 2021-01-26 MED ORDER — LIDOCAINE HCL (PF) 2 % IJ SOLN
INTRAMUSCULAR | Status: AC
  Filled 2021-01-26: qty 5

## 2021-01-26 MED ORDER — PROPOFOL 10 MG/ML IV BOLUS
INTRAVENOUS | Status: AC
  Filled 2021-01-26: qty 40

## 2021-01-26 MED ORDER — PROPOFOL 10 MG/ML IV BOLUS
INTRAVENOUS | Status: DC | PRN
  Administered 2021-01-26: 150 mg via INTRAVENOUS

## 2021-01-26 MED ORDER — CEFAZOLIN SODIUM-DEXTROSE 2-4 GM/100ML-% IV SOLN
INTRAVENOUS | Status: AC
  Filled 2021-01-26: qty 100

## 2021-01-26 SURGICAL SUPPLY — 19 items
BAG DRAIN URO-CYSTO SKYTR STRL (DRAIN) ×3 IMPLANT
BAG DRN UROCATH (DRAIN) ×1
BALLN OPTILUME DCB 30X5X75 (BALLOONS) ×3
BALLOON OPTILUME DCB 30X5X75 (BALLOONS) ×1 IMPLANT
CATH COUDE FOLEY 2W 5CC 16FR (CATHETERS) ×3 IMPLANT
CATH FOLEY 2W COUNCIL 5CC 16FR (CATHETERS) ×3 IMPLANT
CLOTH BEACON ORANGE TIMEOUT ST (SAFETY) ×3 IMPLANT
GLOVE SURG POLYISO LF SZ8 (GLOVE) ×3 IMPLANT
GLOVE SURG UNDER POLY LF SZ7 (GLOVE) ×6 IMPLANT
GOWN STRL REUS W/TWL LRG LVL3 (GOWN DISPOSABLE) ×3 IMPLANT
GOWN STRL REUS W/TWL XL LVL3 (GOWN DISPOSABLE) ×3 IMPLANT
GUIDEWIRE STR DUAL SENSOR (WIRE) ×3 IMPLANT
KIT TURNOVER CYSTO (KITS) ×3 IMPLANT
MANIFOLD NEPTUNE II (INSTRUMENTS) ×3 IMPLANT
PACK CYSTO (CUSTOM PROCEDURE TRAY) ×3 IMPLANT
TUBE CONNECTING 12'X1/4 (SUCTIONS) ×1
TUBE CONNECTING 12X1/4 (SUCTIONS) ×2 IMPLANT
WATER STERILE IRR 3000ML UROMA (IV SOLUTION) ×3 IMPLANT
WATER STERILE IRR 500ML POUR (IV SOLUTION) ×3 IMPLANT

## 2021-01-26 NOTE — Transfer of Care (Signed)
Immediate Anesthesia Transfer of Care Note  Patient: Bryce Stewart  Procedure(s) Performed: Procedure(s) (LRB): CYSTOSCOPY WITH URETHRAL DILATATION OPTILUME BALLOON (N/A)  Patient Location: PACU  Anesthesia Type: General  Level of Consciousness: awake, alert  and oriented  Airway & Oxygen Therapy: Patient Spontanous Breathing and Patient connected to nasal cannula oxygen  Post-op Assessment: Report given to PACU RN and Post -op Vital signs reviewed and stable  Post vital signs: Reviewed and stable  Complications: No apparent anesthesia complications  Last Vitals:  Vitals Value Taken Time  BP    Temp    Pulse 72 01/26/21 0923  Resp 14 01/26/21 0923  SpO2 95 % 01/26/21 0923  Vitals shown include unvalidated device data.  Last Pain:  Vitals:   01/26/21 0735  TempSrc: Oral  PainSc: 0-No pain      Patients Stated Pain Goal: 5 (01/26/21 0735)  Complications: No notable events documented.

## 2021-01-26 NOTE — Anesthesia Procedure Notes (Signed)
Procedure Name: LMA Insertion Date/Time: 01/26/2021 7:36 AM Performed by: Norva Pavlov, CRNA Pre-anesthesia Checklist: Patient identified, Emergency Drugs available, Suction available and Patient being monitored Patient Re-evaluated:Patient Re-evaluated prior to induction Oxygen Delivery Method: Circle system utilized Preoxygenation: Pre-oxygenation with 100% oxygen Induction Type: IV induction Ventilation: Mask ventilation without difficulty LMA: LMA inserted LMA Size: 4.0 Number of attempts: 1 Airway Equipment and Method: Bite block Placement Confirmation: positive ETCO2 Tube secured with: Tape Dental Injury: Teeth and Oropharynx as per pre-operative assessment

## 2021-01-26 NOTE — Discharge Instructions (Addendum)
CYSTOSCOPY HOME CARE INSTRUCTIONS  Activity: Rest for the remainder of the day.  Do not drive or operate equipment today.  You may resume normal activities in one to two days as instructed by your physician.   Meals: Drink plenty of liquids and eat light foods such as gelatin or soup this evening.  You may return to a normal meal plan tomorrow.  Return to Work: You may return to work in one to two days or as instructed by your physician.  Special Instructions / Symptoms: Call your physician if any of these symptoms occur:   -persistent or heavy bleeding  -bleeding which continues after first few urination  -large blood clots that are difficult to pass  -urine stream diminishes or stops completely  -fever equal to or higher than 101 degrees Farenheit.  -cloudy urine with a strong, foul odor  -severe pain  You may feel some burning pain when you urinate.  This should disappear with time.  Applying moist heat to the lower abdomen or a hot tub bath may help relieve the pain. \  You may remove the catheter at home on Monday morning but cutting off the side arm with the nipple.  The catheter should slide out easily.  If you would prefer, you could come to the office Monday morning to have the catheter removed,  If you plan to come, please call.  For 30 days after the procedure, you need to use a condom for sexual activity because of the medicine applied during the procedure and avoid attempts at conception for 6 months.   Patient Signature:  ________________________________________________________  Nurse's Signature:  ________________________________________________________  Post Anesthesia Home Care Instructions  Activity: Get plenty of rest for the remainder of the day. A responsible adult should stay with you for 24 hours following the procedure.  For the next 24 hours, DO NOT: -Drive a car -Advertising copywriter -Drink alcoholic beverages -Take any medication unless instructed by  your physician -Make any legal decisions or sign important papers.  Meals: Start with liquid foods such as gelatin or soup. Progress to regular foods as tolerated. Avoid greasy, spicy, heavy foods. If nausea and/or vomiting occur, drink only clear liquids until the nausea and/or vomiting subsides. Call your physician if vomiting continues.  Special Instructions/Symptoms: Your throat may feel dry or sore from the anesthesia or the breathing tube placed in your throat during surgery. If this causes discomfort, gargle with warm salt water. The discomfort should disappear within 24 hours.

## 2021-01-26 NOTE — Op Note (Signed)
Procedure: Cystoscopy with Optilume urethral balloon dilation.  Preop diagnosis: Recurrent bulbar urethral stricture.  Postop diagnosis: Same.  Surgeon: Dr. Irine Seal.  Anesthesia: General.  Specimen: None.  Drain: 16 [place] coud Foley catheter.  EBL: None.  Complications: None.  Indications: The patient is a 55 year old male with a history of a bulbar urethral stricture and prostatic abscess that previously been managed with TURP and dilation of the stricture.  He returned recently with recurrent stricture.  Procedure: He had been on doxycycline p.o. preoperatively and was given Ancef in the operating room.  A general anesthetic was induced.  He was placed in lithotomy position and fitted with PAS hose.  His perineum and genitalia were prepped with Betadine solution and he was draped in the usual sterile fashion.  Cystoscopy was performed using a 23 [place] scope and 30 degree lens.  Examination revealed a normal anterior and distal bulbar urethra but there was a tight stricture approximately 8 French in diameter in the proximal bulb with some keratinization.  The cystoscope was exchanged for the 6.5 French short ureteroscope which was then advanced through the stricture.  He was noted to have some proximal keratinization with a little bit of dystrophic calcification.  The narrowed portion of the stricture was approximately 5 mm in length but there was scarring approximately a centimeter to 1-1/2 cm on either side.  The external sphincter was intact.  The prostatic urethra was widely patent.  Examination of bladder revealed mild to moderate trabeculation without mucosal lesions.  Ureteral orifices were unremarkable.  A sensor wire was advanced into the bladder under visual and fluoroscopic guidance and ureteroscope was removed.  The cystoscope was then reinserted over the wire into the distal bulbar urethra.  A 5 cm x 30 French Optilume paclitaxel coated balloon was advanced over the  wire into the bulbar urethra.  The balloon was allowed to activate for 1 minute and then was advanced across the stricture under fluoroscopic and visual guidance.  Once properly positioned, with a 1 cm margin on either side of the stricture, the balloon was inflated to 10 atm.  With dilute contrast.  Initially there was a waist in the area of the stricture but this dilated nicely as we approached the maximum inflation pressure.  The balloon was left inflated for 5 minutes with additional fluid added as the pressure waned.  The balloon was then deflated and the cystoscope and balloon were removed.  The cystoscope was then reinserted without irrigation and the stricture appeared to be well dilated but I did not passed through the stricture with the scope.  An attempt was made to pass a 41 French straight catheter but this did not easily go, so a 40 [place] coud catheter was then passed into the bladder without difficulty.  The balloon was inflated with 10 mL of sterile fluid.  The catheter was placed to straight drainage.  He was taken down from lithotomy position, his anesthetic was reversed and he was moved recovery room in stable condition.  There were no complications.

## 2021-01-26 NOTE — Anesthesia Postprocedure Evaluation (Signed)
Anesthesia Post Note  Patient: Bryce Stewart  Procedure(s) Performed: CYSTOSCOPY WITH URETHRAL DILATATION OPTILUME BALLOON (Urethra)     Patient location during evaluation: PACU Anesthesia Type: General Level of consciousness: sedated and patient cooperative Pain management: pain level controlled Vital Signs Assessment: post-procedure vital signs reviewed and stable Respiratory status: spontaneous breathing Cardiovascular status: stable Anesthetic complications: no   No notable events documented.  Last Vitals:  Vitals:   01/26/21 0950 01/26/21 1037  BP:  (!) 159/86  Pulse: 75 64  Resp: 12 15  Temp:  36.5 C  SpO2: 97% 97%    Last Pain:  Vitals:   01/26/21 1037  TempSrc:   PainSc: 0-No pain                 Lewie Loron

## 2021-01-26 NOTE — Interval H&P Note (Signed)
History and Physical Interval Note:  I discussed the need for barrier contraception with a condom for 30 days post op and to avoid attempts at conception for 6 months.   01/26/2021 8:11 AM  Bryce Stewart  has presented today for surgery, with the diagnosis of urethral stricture.  The various methods of treatment have been discussed with the patient and family. After consideration of risks, benefits and other options for treatment, the patient has consented to  Procedure(s): CYSTOSCOPY WITH URETHRAL DILATATION OPTILUME BALLOON (N/A) as a surgical intervention.  The patient's history has been reviewed, patient examined, no change in status, stable for surgery.  I have reviewed the patient's chart and labs.  Questions were answered to the patient's satisfaction.     Bjorn Pippin

## 2021-01-29 ENCOUNTER — Encounter (HOSPITAL_BASED_OUTPATIENT_CLINIC_OR_DEPARTMENT_OTHER): Payer: Self-pay | Admitting: Urology

## 2022-10-27 IMAGING — RF DG RETROGRADE-URETHROGRAM
6 series · 6 of 6 positions shown · non-contrast
Comparison: CT abdomen/pelvis 11/29/2016.

CLINICAL DATA: Traumatic bulbous urethral stricture.

EXAM:
RETROGRADE URETHROGRAM
TECHNIQUE: A retrograde urethrogram was performed using a 12 French Foley
catheter. The catheter tip was inserted into the fossa navicularis.
The catheter balloon was partially inflated. Subsequently, a total
of 60 mL of Takeru Rauf contrast was hand injected over multiple
attempts. Multiple lateral view radiographs were acquired.

[Series 1: fluoro_iodine 1fps_bw · 0.17mm/px · 1 of 1 slices shown (1 of 6)]
[im 1/1]
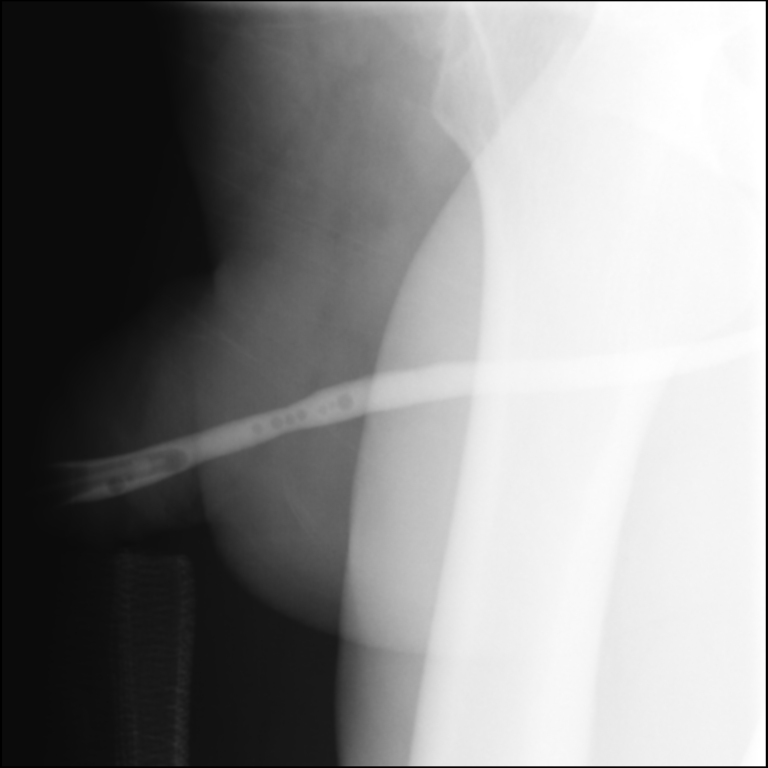

[Series 2: fluoro_iodine 1fps_bw · 0.17mm/px · 1 of 1 slices shown (2 of 6)]
[im 1/1]
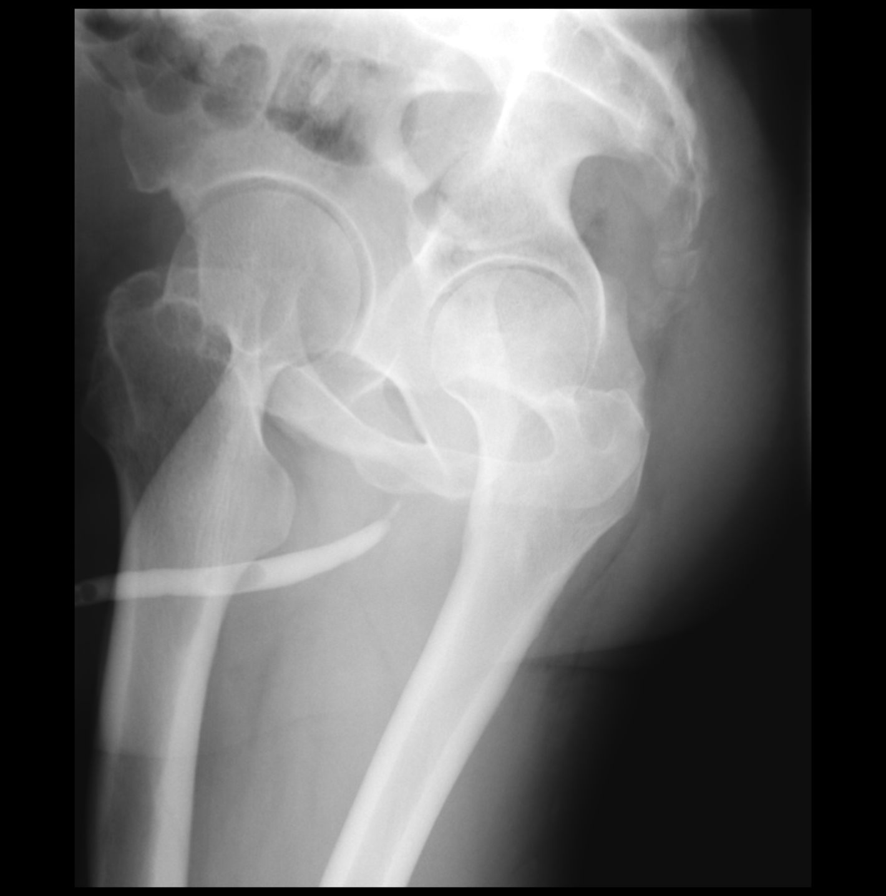

[Series 3: fluoro_iodine 1fps_bw · 0.17mm/px · 1 of 1 slices shown (3 of 6)]
[im 1/1]
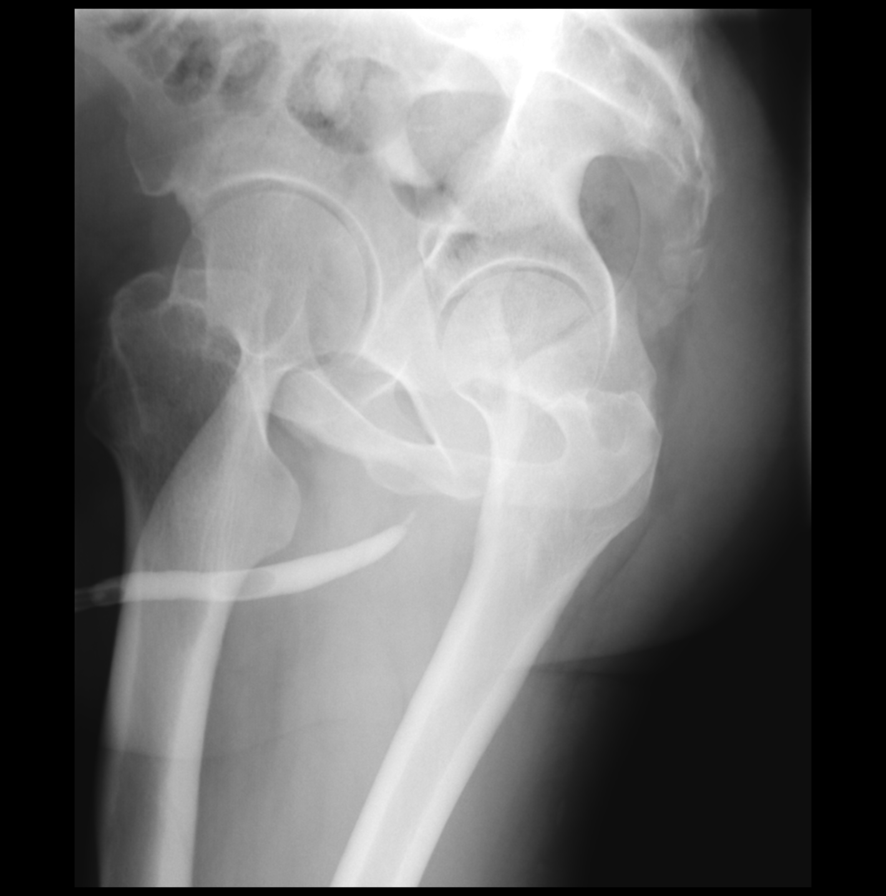

[Series 4: fluoro_iodine 1fps_bw · 0.17mm/px · 1 of 1 slices shown (4 of 6)]
[im 1/1]
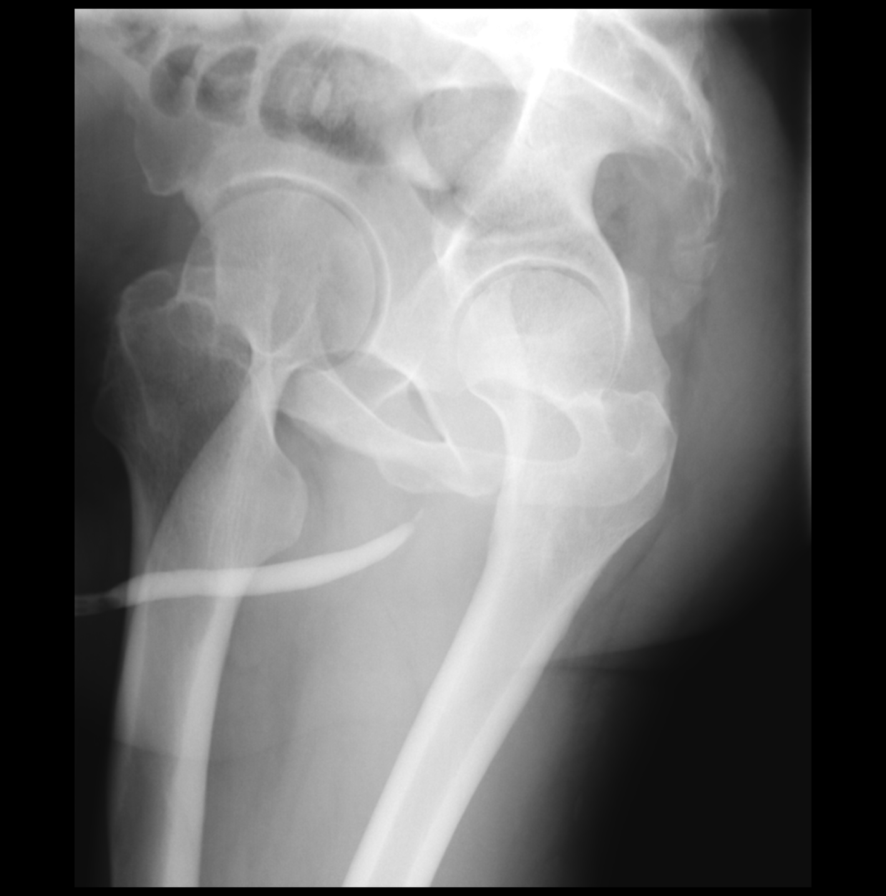

[Series 5: fluoro_iodine 1fps_bw · 0.17mm/px · 1 of 1 slices shown (5 of 6)]
[im 1/1]
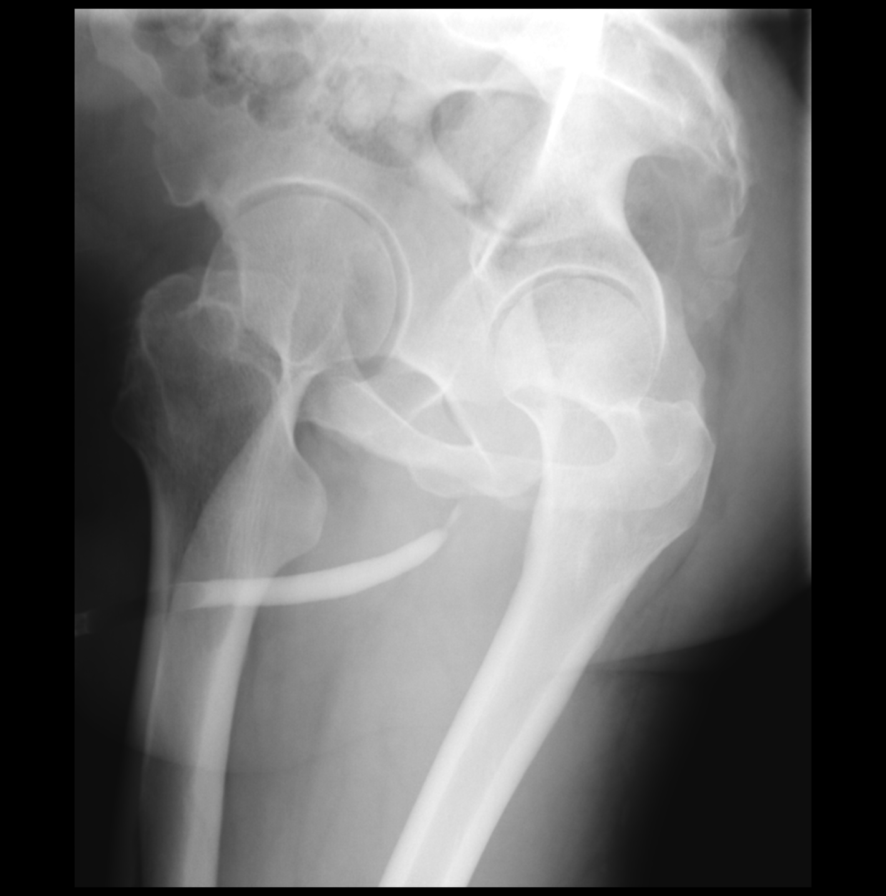

[Series 6: fluoro_iodine 1fps_bw · 0.17mm/px · 1 of 1 slices shown (6 of 6)]
[im 1/1]
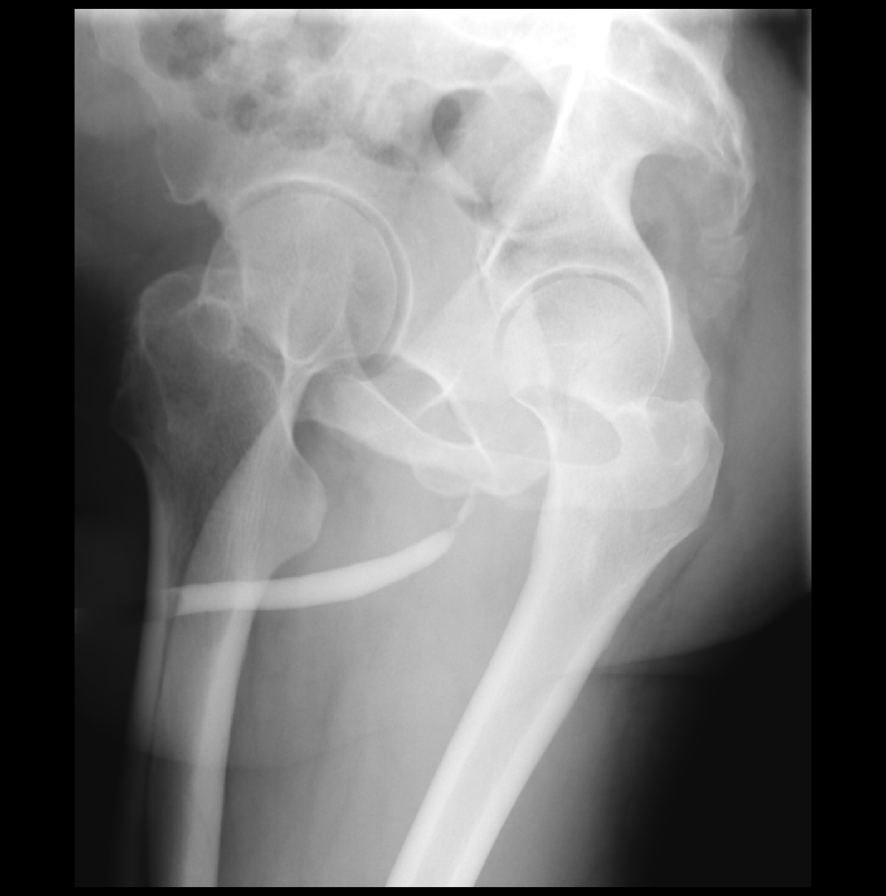

[6 of 6 positions shown; findings below may reference images not displayed]

FINDINGS: The patient was able to tolerate urethral opacification to the level
of the posterior bulbous urethra. There is a focus of at least
moderate narrowing within the posterior bulbous urethra, compatible
with the provided history of traumatic bulbous urethral stricture.
The remainder of the visualized bulbous urethra is normal in caliber
and contour.
IMPRESSION: Urethral opacification was achieved to the level of the posterior
bulbous urethra.

There is a focus of at least moderate narrowing within the posterior
bulbous urethra, compatible with the provided history of traumatic
bulbous urethral stricture.

## 2022-11-07 ENCOUNTER — Ambulatory Visit: Payer: Medicaid Other | Admitting: Podiatry

## 2022-11-07 ENCOUNTER — Ambulatory Visit (INDEPENDENT_AMBULATORY_CARE_PROVIDER_SITE_OTHER): Payer: Medicaid Other

## 2022-11-07 VITALS — Temp 98.1°F

## 2022-11-07 DIAGNOSIS — L02612 Cutaneous abscess of left foot: Secondary | ICD-10-CM

## 2022-11-07 DIAGNOSIS — L03116 Cellulitis of left lower limb: Secondary | ICD-10-CM

## 2022-11-07 DIAGNOSIS — M79672 Pain in left foot: Secondary | ICD-10-CM

## 2022-11-07 DIAGNOSIS — L03119 Cellulitis of unspecified part of limb: Secondary | ICD-10-CM

## 2022-11-07 MED ORDER — AMOXICILLIN-POT CLAVULANATE 875-125 MG PO TABS: 1.0000 | ORAL_TABLET | Freq: Two times a day (BID) | ORAL | 1 refills | Status: AC

## 2022-11-07 NOTE — Progress Notes (Signed)
Subjective:   Patient ID: Bryce Stewart, adult   DOB: 57 y.o.   MRN: [id number]   HPI Patient presents with caregiver who has poor control of diabetes admitted has not taken care of in the last 2 years and 2 weeks ago started develop some swelling of the bottom of his foot and it has been very sore.  Smokes a half a pack of cigarettes per day and is not obese which is in his favor and has had no systemic signs of infection   Review of Systems  All other systems reviewed and are negative.       Objective:  Physical Exam Vitals and nursing note reviewed.  Constitutional:      Appearance: He is well-developed.  Pulmonary:     Effort: Pulmonary effort is normal.  Musculoskeletal:        General: Normal range of motion.  Skin:    General: Skin is warm.  Neurological:     Mental Status: He is alert.     Neurovascular status was found to be intact currently with pulses adequate and patient neurologically moderate diminishment but not severe with diabetes that we do not have an idea as to how to control this.  He is got warmth in his forefoot left and has a keratotic tissue formation plantar left that appears to have stress on it with what appears to be the probability for a localized abscess.  Temperature is normal no proximal edema erythema drainage is noted good digital perfusion     Assessment:  Probability for a abscess left plantar foot with patient who is in very poor control of his diabetes and is not taking it seriously and also smokes     Plan:  H&P reviewed x-ray taken and today using sharp sterile instrumentation I opened up the area was able to get out a purulent pus consistent with probable staph I evacuated this and flushed applied sterile dressing and placed on Augmentin twice daily.  I am sending for blood work and I gave strict instructions to him and caregiver if any fever were to occur he is to check it twice a day or proximal erythema edema he is to go straight to the  hospital and is at high risk for amputation and also I discussed the absolute importance of him seeing a primary care doctor to better get his diabetes under control.  Patient will be seen back and is strongly encouraged to call if any issues were to occur  X-rays were negative for signs of osteolysis or indications of bone infection with this condition

## 2022-11-08 ENCOUNTER — Other Ambulatory Visit: Payer: Self-pay | Admitting: Podiatry

## 2022-11-08 ENCOUNTER — Telehealth: Payer: Self-pay

## 2022-11-08 DIAGNOSIS — L02619 Cutaneous abscess of unspecified foot: Secondary | ICD-10-CM

## 2022-11-08 DIAGNOSIS — M79672 Pain in left foot: Secondary | ICD-10-CM

## 2022-11-08 LAB — CBC WITH DIFFERENTIAL/PLATELET
Absolute Monocytes: 893 cells/uL (ref 200–950)
Basophils Absolute: 48 cells/uL (ref 0–200)
Basophils Relative: 0.4 %
Eosinophils Absolute: 71 cells/uL (ref 15–500)
Eosinophils Relative: 0.6 %
HCT: 44.6 % (ref 38.5–50.0)
Hemoglobin: 14.8 g/dL (ref 13.2–17.1)
Lymphs Abs: 1666 cells/uL (ref 850–3900)
MCH: 28.3 pg (ref 27.0–33.0)
MCHC: 33.2 g/dL (ref 32.0–36.0)
MCV: 85.3 fL (ref 80.0–100.0)
MPV: 10.6 fL (ref 7.5–12.5)
Monocytes Relative: 7.5 %
Neutro Abs: 9223 cells/uL — ABNORMAL HIGH (ref 1500–7800)
Neutrophils Relative %: 77.5 %
Platelets: 272 10*3/uL (ref 140–400)
RBC: 5.23 10*6/uL (ref 4.20–5.80)
RDW: 12.7 % (ref 11.0–15.0)
Total Lymphocyte: 14 %
WBC: 11.9 10*3/uL — ABNORMAL HIGH (ref 3.8–10.8)

## 2022-11-08 LAB — SEDIMENTATION RATE: Sed Rate: 33 mm/h — ABNORMAL HIGH (ref 0–20)

## 2022-11-08 LAB — HEMOGLOBIN A1C
Hgb A1c MFr Bld: 12.6 % of total Hgb — ABNORMAL HIGH (ref <5.7)
Mean Plasma Glucose: 315 mg/dL
eAG (mmol/L): 17.4 mmol/L

## 2022-11-08 MED ORDER — HYDROCODONE-ACETAMINOPHEN 10-325 MG PO TABS: 1.0000 | ORAL_TABLET | ORAL | 0 refills | Status: AC | PRN

## 2022-11-08 NOTE — Telephone Encounter (Signed)
Everything was ok with lab results. Slight increase in white blood cell but should not be a problem at this time. Continue antibiotic and should start to feel better. I will call in pain medicine

## 2022-11-08 NOTE — Telephone Encounter (Signed)
Patient is aware and verbalized understanding of information given. 

## 2022-11-13 ENCOUNTER — Ambulatory Visit: Payer: No Typology Code available for payment source | Admitting: Podiatry

## 2022-11-14 ENCOUNTER — Other Ambulatory Visit: Payer: Self-pay | Admitting: Podiatry

## 2022-11-14 ENCOUNTER — Telehealth: Payer: Self-pay | Admitting: *Deleted

## 2022-11-14 MED ORDER — HYDROCODONE-ACETAMINOPHEN 10-325 MG PO TABS: 1.0000 | ORAL_TABLET | Freq: Three times a day (TID) | ORAL | 0 refills | Status: AC | PRN

## 2022-11-14 NOTE — Telephone Encounter (Signed)
Patient has been updated thru voice message.

## 2022-11-14 NOTE — Telephone Encounter (Signed)
Wife is calling to request pain medicine(hydroc-ace, 10/325 mg) refill for patient, still in a lot of pain w/ swelling, please advise.

## 2022-11-14 NOTE — Telephone Encounter (Signed)
Should be seen next week if still having pain or if having any systemic issues should go to hospital. I will write for a few pain pills

## 2023-02-10 ENCOUNTER — Ambulatory Visit: Payer: Medicaid Other | Admitting: Podiatry

## 2023-02-19 ENCOUNTER — Ambulatory Visit (INDEPENDENT_AMBULATORY_CARE_PROVIDER_SITE_OTHER): Payer: Medicaid Other | Admitting: Podiatry

## 2023-02-19 DIAGNOSIS — Z91199 Patient's noncompliance with other medical treatment and regimen due to unspecified reason: Secondary | ICD-10-CM

## 2023-02-19 NOTE — Progress Notes (Signed)
1. No-show for appointment     

## 2024-09-04 ENCOUNTER — Emergency Department (HOSPITAL_COMMUNITY)
Admission: EM | Admit: 2024-09-04 | Discharge: 2024-09-05 | Disposition: A | Payer: Self-pay | Attending: Emergency Medicine | Admitting: Emergency Medicine

## 2024-09-04 ENCOUNTER — Emergency Department (HOSPITAL_COMMUNITY): Payer: Self-pay

## 2024-09-04 ENCOUNTER — Other Ambulatory Visit: Payer: Self-pay

## 2024-09-04 ENCOUNTER — Encounter (HOSPITAL_COMMUNITY): Payer: Self-pay

## 2024-09-04 DIAGNOSIS — J101 Influenza due to other identified influenza virus with other respiratory manifestations: Secondary | ICD-10-CM | POA: Insufficient documentation

## 2024-09-04 DIAGNOSIS — Z79899 Other long term (current) drug therapy: Secondary | ICD-10-CM | POA: Insufficient documentation

## 2024-09-04 DIAGNOSIS — Z7984 Long term (current) use of oral hypoglycemic drugs: Secondary | ICD-10-CM | POA: Insufficient documentation

## 2024-09-04 DIAGNOSIS — I10 Essential (primary) hypertension: Secondary | ICD-10-CM | POA: Insufficient documentation

## 2024-09-04 DIAGNOSIS — E119 Type 2 diabetes mellitus without complications: Secondary | ICD-10-CM | POA: Insufficient documentation

## 2024-09-04 LAB — RESP PANEL BY RT-PCR (RSV, FLU A&B, COVID)  RVPGX2
Influenza A by PCR: POSITIVE — AB
Influenza B by PCR: NEGATIVE
Resp Syncytial Virus by PCR: NEGATIVE
SARS Coronavirus 2 by RT PCR: NEGATIVE

## 2024-09-04 LAB — CBC WITH DIFFERENTIAL/PLATELET
Abs Immature Granulocytes: 0.02 K/uL (ref 0.00–0.07)
Basophils Absolute: 0 K/uL (ref 0.0–0.1)
Basophils Relative: 0 %
Eosinophils Absolute: 0 K/uL (ref 0.0–0.5)
Eosinophils Relative: 0 %
HCT: 45.3 % (ref 39.0–52.0)
Hemoglobin: 14.7 g/dL (ref 13.0–17.0)
Immature Granulocytes: 0 %
Lymphocytes Relative: 7 %
Lymphs Abs: 0.6 K/uL — ABNORMAL LOW (ref 0.7–4.0)
MCH: 28.3 pg (ref 26.0–34.0)
MCHC: 32.5 g/dL (ref 30.0–36.0)
MCV: 87.3 fL (ref 80.0–100.0)
Monocytes Absolute: 1 K/uL (ref 0.1–1.0)
Monocytes Relative: 13 %
Neutro Abs: 6.2 K/uL (ref 1.7–7.7)
Neutrophils Relative %: 80 %
Platelets: 207 K/uL (ref 150–400)
RBC: 5.19 MIL/uL (ref 4.22–5.81)
RDW: 13.4 % (ref 11.5–15.5)
WBC: 7.8 K/uL (ref 4.0–10.5)
nRBC: 0 % (ref 0.0–0.2)

## 2024-09-04 LAB — COMPREHENSIVE METABOLIC PANEL WITH GFR
ALT: 15 U/L (ref 0–44)
AST: 18 U/L (ref 15–41)
Albumin: 4.2 g/dL (ref 3.5–5.0)
Alkaline Phosphatase: 107 U/L (ref 38–126)
Anion gap: 14 (ref 5–15)
BUN: 14 mg/dL (ref 6–20)
CO2: 25 mmol/L (ref 22–32)
Calcium: 9.5 mg/dL (ref 8.9–10.3)
Chloride: 97 mmol/L — ABNORMAL LOW (ref 98–111)
Creatinine, Ser: 1.29 mg/dL — ABNORMAL HIGH (ref 0.61–1.24)
GFR, Estimated: >60 mL/min
Glucose, Bld: 180 mg/dL — ABNORMAL HIGH (ref 70–99)
Potassium: 4.3 mmol/L (ref 3.5–5.1)
Sodium: 136 mmol/L (ref 135–145)
Total Bilirubin: 0.6 mg/dL (ref 0.0–1.2)
Total Protein: 7.2 g/dL (ref 6.5–8.1)

## 2024-09-04 MED ORDER — ACETAMINOPHEN 325 MG PO TABS
650.0000 mg | ORAL_TABLET | Freq: Once | ORAL | Status: AC | PRN
  Administered 2024-09-04: 650 mg via ORAL
  Filled 2024-09-04: qty 2

## 2024-09-04 MED ORDER — LACTATED RINGERS IV BOLUS
1000.0000 mL | Freq: Once | INTRAVENOUS | Status: AC
  Administered 2024-09-04: 1000 mL via INTRAVENOUS

## 2024-09-04 MED ORDER — ONDANSETRON 4 MG PO TBDP
4.0000 mg | ORAL_TABLET | Freq: Once | ORAL | Status: AC
  Administered 2024-09-04: 4 mg via ORAL
  Filled 2024-09-04: qty 1

## 2024-09-04 NOTE — ED Provider Notes (Incomplete)
 " Bryce Stewart Provider Note   CSN: [id number] Arrival date & time: 09/04/24  1843     Patient presents with: Influenza   Bryce Stewart is a 59 y.o. adult.  {Add pertinent medical, surgical, social history, OB history to [id number]}  Influenza Presenting symptoms: diarrhea, fatigue, fever, headache, myalgias, nausea and vomiting   Patient presents for flulike symptoms.  Medical history includes DM, HTN, HLD.  Since yesterday, he has had nausea, vomiting, diarrhea, cough, decreased appetite, headache, dizziness.  He was febrile in ED triage.  He was given Tylenol .  Currently, symptoms are improved.     Prior to Admission medications  Medication Sig Start Date End Date Taking? Authorizing Provider  amoxicillin -clavulanate (AUGMENTIN ) 875-125 MG tablet Take 1 tablet by mouth 2 (two) times daily. 11/07/22   Magdalen Pasco RAMAN, DPM  atorvastatin  (LIPITOR) 20 MG tablet Take 20 mg by mouth daily.    [provider]  doxycycline (VIBRAMYCIN) 100 MG capsule Take 100 mg by mouth 2 (two) times daily. Starts 01-24-2021 per pt    [provider]  glipiZIDE (GLUCOTROL) 5 MG tablet Take by mouth daily before breakfast. Pt stopped since January 2022, will restart 01-24-2021    [provider]  loratadine -pseudoephedrine  (CLARITIN -D 12 HOUR) 5-120 MG tablet Take 1 tablet by mouth 2 (two) times daily. 05/05/17   Armida Culver, PA-C  metFORMIN  (GLUCOPHAGE ) 500 MG tablet Take 500 mg by mouth 2 (two) times daily with a meal.    [provider]    Allergies: Patient has no known allergies.    Review of Systems  Constitutional:  Positive for activity change, appetite change, fatigue and fever.  Gastrointestinal:  Positive for diarrhea, nausea and vomiting.  Musculoskeletal:  Positive for myalgias.  Neurological:  Positive for dizziness and headaches.  All other systems reviewed and are negative.   Updated Vital Signs BP (!) 145/77  (BP Location: Right Arm)   Pulse (!) 117   Temp (!) 101.8 F (38.8 C) (Oral)   Resp (!) 22   Ht 5' 7 (1.702 m)   Wt 79.4 kg   SpO2 97%   BMI 27.41 kg/m   Physical Exam Vitals and nursing note reviewed.  Constitutional:      General: He is not in acute distress.    Appearance: Normal appearance. He is well-developed. He is not ill-appearing, toxic-appearing or diaphoretic.  HENT:     Head: Normocephalic and atraumatic.     Right Ear: External ear normal.     Left Ear: External ear normal.     Nose: Nose normal.     Mouth/Throat:     Mouth: Mucous membranes are moist.  Eyes:     Extraocular Movements: Extraocular movements intact.     Conjunctiva/sclera: Conjunctivae normal.  Cardiovascular:     Rate and Rhythm: Normal rate and regular rhythm.     Heart sounds: No murmur heard. Pulmonary:     Effort: Pulmonary effort is normal. No respiratory distress.     Breath sounds: Normal breath sounds. No wheezing or rales.  Abdominal:     General: There is no distension.     Palpations: Abdomen is soft.     Tenderness: There is no abdominal tenderness.  Musculoskeletal:        General: No swelling. Normal range of motion.     Cervical back: Normal range of motion and neck supple.  Skin:    General: Skin is warm and dry.  Coloration: Skin is not jaundiced or pale.  Neurological:     General: No focal deficit present.     Mental Status: He is alert and oriented to person, place, and time.  Psychiatric:        Mood and Affect: Mood normal.        Behavior: Behavior normal.     (all labs ordered are listed, but only abnormal results are displayed) Labs Reviewed  RESP PANEL BY RT-PCR (RSV, FLU A&B, COVID)  RVPGX2 - Abnormal; Notable for the following components:      Result Value   Influenza A by PCR POSITIVE (*)    All other components within normal limits  COMPREHENSIVE METABOLIC PANEL WITH GFR - Abnormal; Notable for the following components:   Chloride 97 (*)     Glucose, Bld 180 (*)    Creatinine, Ser 1.29 (*)    All other components within normal limits  CBC WITH DIFFERENTIAL/PLATELET - Abnormal; Notable for the following components:   Lymphs Abs 0.6 (*)    All other components within normal limits    EKG: None  Radiology: DG Chest 2 View Result Date: 09/04/2024 EXAM: 2 VIEW(S) XRAY OF THE CHEST 09/04/2024 08:10:10 PM COMPARISON: None available. CLINICAL HISTORY: URI symptoms FINDINGS: LUNGS AND PLEURA: No focal pulmonary opacity. No pleural effusion. No pneumothorax. HEART AND MEDIASTINUM: No acute abnormality of the cardiac and mediastinal silhouettes. BONES AND SOFT TISSUES: No acute osseous abnormality. IMPRESSION: 1. No acute cardiopulmonary abnormality. Electronically signed by: Pinkie Pebbles MD 09/04/2024 08:12 PM EST RP Workstation: [id number]    {Document cardiac monitor, telemetry assessment procedure when appropriate:32947} Procedures   Medications Ordered in the ED  acetaminophen  (TYLENOL ) tablet 650 mg (650 mg Oral Given 09/04/24 2037)      {Click here for ABCD2, HEART and other calculators REFRESH Note before signing:1}                              Medical Decision Making Amount and/or Complexity of Data Reviewed Labs: ordered. Radiology: ordered.  Risk OTC drugs. Prescription drug management.   Patient presents for flulike symptoms since yesterday.  Prior to being bedded in the ED, workup was initiated.  He was found to be febrile in ED triage and was given Tylenol  with defervescence of fever.  His lab work shows normal hemoglobin, no leukocytosis, creatinine slightly increased at baseline, normal electrolytes.  Chest x-ray does not show any acute findings.  He is found to be positive for influenza A.  On my initial assessment, patient is well-appearing.  He states that his symptoms have improved after Tylenol .  His breathing is unlabored.  Lungs are clear to auscultation.  He was advised that treatment for influenza is  supportive care and time.  I spoke with him about Tamiflu.  Given that he is already been having diarrhea, this may worsen his dehydration.  He would like a prescription but states that he will discontinue if he feels like it makes his diarrhea worse.  Patient to be prescribed Zofran  as well.  Given his mild increase in creatinine, will give IV fluids for hydration.***  {Document critical care time when appropriate  Document review of labs and clinical decision tools ie CHADS2VASC2, etc  Document your independent review of radiology images and any outside records  Document your discussion with family members, caretakers and with consultants  Document social determinants of health affecting pt's care  Document your decision making  why or why not admission, treatments were needed:32947:::1}   Final diagnoses:  None    ED Discharge Orders     None        "

## 2024-09-04 NOTE — ED Triage Notes (Signed)
 Pt c/o flu like symptoms; onset yesterday; pt states N/V/D; last BM approx. 2 hours ago, watery consistency; pt states last PO intake yesterday; pt states unable to keep anything down; pt denies headache; pt states slightly dizzy; fever in triage (102.9); A&Ox4

## 2024-09-05 MED ORDER — ONDANSETRON 4 MG PO TBDP: 4.0000 mg | ORAL_TABLET | Freq: Three times a day (TID) | ORAL | 0 refills | Status: AC | PRN

## 2024-09-05 MED ORDER — BENZONATATE 100 MG PO CAPS: 100.0000 mg | ORAL_CAPSULE | Freq: Three times a day (TID) | ORAL | 0 refills | Status: AC | PRN

## 2024-09-05 NOTE — Discharge Instructions (Addendum)
 You have the flu.  Treatment for this is supportive care. Take ibuprofen  and/or tylenol  as needed for fever, aches and pains. Drink plenty of fluids to stay hydrated.   Prescriptions were sent to your pharmacy: -Take ondansetron  as needed for nausea. -Take benzonatate  as needed for cough.  Return to the emergency department for any new or worsening symptoms of concern.
